# Patient Record
Sex: Male | Born: 1955 | Race: Black or African American | Hispanic: No | Marital: Married | Smoking: Never smoker
Health system: Southern US, Community
[De-identification: ages and names within clinical notes are randomized; demographics above are authoritative.]

## PROBLEM LIST (undated history)

## (undated) DIAGNOSIS — I10 Essential (primary) hypertension: Secondary | ICD-10-CM

---

## 2011-06-30 ENCOUNTER — Emergency Department (HOSPITAL_COMMUNITY)
Admission: EM | Admit: 2011-06-30 | Discharge: 2011-06-30 | Disposition: A | Discharge status: Home / Self Care | Payer: Self-pay | Attending: Emergency Medicine | Admitting: Emergency Medicine

## 2011-06-30 ENCOUNTER — Encounter: Payer: Self-pay | Admitting: *Deleted

## 2011-06-30 DIAGNOSIS — Z79899 Other long term (current) drug therapy: Secondary | ICD-10-CM | POA: Insufficient documentation

## 2011-06-30 DIAGNOSIS — M25579 Pain in unspecified ankle and joints of unspecified foot: Secondary | ICD-10-CM | POA: Insufficient documentation

## 2011-06-30 DIAGNOSIS — I10 Essential (primary) hypertension: Secondary | ICD-10-CM | POA: Insufficient documentation

## 2011-06-30 DIAGNOSIS — M109 Gout, unspecified: Secondary | ICD-10-CM | POA: Insufficient documentation

## 2011-06-30 DIAGNOSIS — M25473 Effusion, unspecified ankle: Secondary | ICD-10-CM | POA: Insufficient documentation

## 2011-06-30 DIAGNOSIS — M25476 Effusion, unspecified foot: Secondary | ICD-10-CM | POA: Insufficient documentation

## 2011-06-30 DIAGNOSIS — M79609 Pain in unspecified limb: Secondary | ICD-10-CM | POA: Insufficient documentation

## 2011-06-30 HISTORY — DX: Essential (primary) hypertension: I10

## 2011-06-30 MED ORDER — OXYCODONE-ACETAMINOPHEN 5-325 MG PO TABS
1.0000 | ORAL_TABLET | Freq: Once | ORAL | Status: AC
  Administered 2011-06-30: 1 via ORAL
  Filled 2011-06-30: qty 1

## 2011-06-30 MED ORDER — PREDNISONE 20 MG PO TABS
60.0000 mg | ORAL_TABLET | Freq: Once | ORAL | Status: AC
  Administered 2011-06-30: 60 mg via ORAL
  Filled 2011-06-30: qty 3

## 2011-06-30 MED ORDER — OXYCODONE-ACETAMINOPHEN 5-325 MG PO TABS: 1.0000 | ORAL_TABLET | Freq: Four times a day (QID) | ORAL | Status: AC | PRN

## 2011-06-30 MED ORDER — PREDNISONE 20 MG PO TABS: ORAL_TABLET | ORAL | Status: AC

## 2011-06-30 NOTE — ED Provider Notes (Signed)
History     CSN: [id number] Arrival date & time: 06/30/2011  8:32 AM   First MD Initiated Contact with Patient 06/30/11 [phone]      Chief Complaint  Patient presents with  . Foot Pain    (Consider location/radiation/quality/duration/timing/severity/associated sxs/prior treatment) HPI Comments: Patient with history of gout presents with flare in his right medial foot. The patient states that he thinks he might have eaten something he shouldn't have during Thanksgiving.  He has been taking diclofenac as prescribed during a previous flare however this is not helping. Patient denies fevers, chills, nausea or vomiting. He states this attack is similar to previous.  Patient is a 55 y.o. male presenting with lower extremity pain. The history is provided by the patient.  Foot Pain This is a recurrent problem. The current episode started in the past 7 days. The problem has been gradually worsening. Associated symptoms include arthralgias and joint swelling. Pertinent negatives include no chills, fever, numbness or weakness. The symptoms are aggravated by walking (Palpation). He has tried NSAIDs for the symptoms.    Past Medical History  Diagnosis Date  . Hypertension   . Gout     History reviewed. No pertinent past surgical history.  No family history on file.  History  Substance Use Topics  . Smoking status: Never Smoker   . Smokeless tobacco: Not on file  . Alcohol Use: No      Review of Systems  Constitutional: Negative for fever and chills.  Musculoskeletal: Positive for joint swelling and arthralgias. Negative for back pain.  Skin: Negative for wound.  Neurological: Negative for weakness and numbness.    Allergies  Review of patient's allergies indicates no known allergies.  Home Medications   Current Outpatient Rx  Name Route Sig Dispense Refill  . DICLOFENAC SODIUM 75 MG PO TBEC Oral Take 75 mg by mouth 2 (two) times daily as needed. For gout attack     .  METOPROLOL TARTRATE PO Oral Take by mouth.        BP 165/100  Pulse 96  Temp(Src) 98.1 F (36.7 C) (Oral)  Resp 20  SpO2 97%  Physical Exam  Nursing note and vitals reviewed. Constitutional: He is oriented to person, place, and time. He appears well-developed and well-nourished.  HENT:  Head: Normocephalic and atraumatic.  Eyes: Conjunctivae are normal. Right eye exhibits no discharge. Left eye exhibits no discharge.  Neck: Normal range of motion. Neck supple.  Musculoskeletal: He exhibits tenderness. He exhibits no edema.       Inflammation of R medial foot at IP, 1st MTP, and tarsal/metatarasal joint c/w gout. Mild overlying erythema, warm to touch.   Neurological: He is alert and oriented to person, place, and time.  Skin: Skin is warm and dry.  Psychiatric: He has a normal mood and affect.    ED Course  Procedures (including critical care time)  Labs Reviewed - No data to display No results found.   1. Gout     9:10 AM Patient seen and examined.   9:26 AM Patient counseled on use of narcotic pain medications. Counseled not to combine these medications with others containing tylenol. Urged not to drink alcohol, drive, or perform any other activities that requires focus while taking these medications. The patient verbalizes understanding and agrees with the plan. Counseled on avoidance and PCP f/u.    MDM  Patient with right foot pain and swelling consistent with gouty arthritis. There is no fever or concerns for septic arthritis.  Eustace Moore Ray, [place] 06/30/11 [phone]

## 2011-06-30 NOTE — ED Notes (Signed)
Patient has gout in his right foot x 3 days.  He has hx of same

## 2011-06-30 NOTE — ED Provider Notes (Signed)
Evaluation and management procedures were performed by the PA/NP under my supervision/collaboration.   Vasilis Luhman, MD 06/30/11 1551 

## 2011-07-16 ENCOUNTER — Encounter (HOSPITAL_COMMUNITY): Payer: Self-pay | Admitting: Emergency Medicine

## 2011-07-16 ENCOUNTER — Emergency Department (INDEPENDENT_AMBULATORY_CARE_PROVIDER_SITE_OTHER)
Admission: EM | Admit: 2011-07-16 | Discharge: 2011-07-16 | Disposition: A | Discharge status: Home / Self Care | Payer: Self-pay | Source: Home / Self Care | Attending: Emergency Medicine | Admitting: Emergency Medicine

## 2011-07-16 DIAGNOSIS — N390 Urinary tract infection, site not specified: Secondary | ICD-10-CM

## 2011-07-16 DIAGNOSIS — D696 Thrombocytopenia, unspecified: Secondary | ICD-10-CM

## 2011-07-16 LAB — DIFFERENTIAL
Basophils Absolute: 0 10*3/uL (ref 0.0–0.1)
Basophils Relative: 0 % (ref 0–1)
Eosinophils Relative: 2 % (ref 0–5)
Monocytes Absolute: 1.3 10*3/uL — ABNORMAL HIGH (ref 0.1–1.0)
Monocytes Relative: 16 % — ABNORMAL HIGH (ref 3–12)
Neutro Abs: 3.4 10*3/uL (ref 1.7–7.7)

## 2011-07-16 LAB — POCT URINALYSIS DIP (DEVICE)
Ketones, ur: NEGATIVE mg/dL
Leukocytes, UA: NEGATIVE
Protein, ur: 100 mg/dL — AB
Urobilinogen, UA: 2 mg/dL — ABNORMAL HIGH (ref 0.0–1.0)
pH: 5.5 (ref 5.0–8.0)

## 2011-07-16 LAB — CBC
HCT: 44.4 % (ref 39.0–52.0)
Hemoglobin: 15.7 g/dL (ref 13.0–17.0)
MCH: 34 pg (ref 26.0–34.0)
MCHC: 35.4 g/dL (ref 30.0–36.0)
MCV: 96.1 fL (ref 78.0–100.0)
RDW: 13.1 % (ref 11.5–15.5)

## 2011-07-16 MED ORDER — KETOROLAC TROMETHAMINE 60 MG/2ML IM SOLN
INTRAMUSCULAR | Status: AC
  Filled 2011-07-16: qty 2

## 2011-07-16 MED ORDER — CIPROFLOXACIN HCL 500 MG PO TABS: 500.0000 mg | ORAL_TABLET | Freq: Two times a day (BID) | ORAL | Status: AC

## 2011-07-16 NOTE — ED Provider Notes (Signed)
History     CSN: [id number] Arrival date & time: 07/16/2011 11:53 AM   First MD Initiated Contact with Patient 07/16/11 1213      Chief Complaint  Patient presents with  . Back Pain  . Abdominal Pain    (Consider location/radiation/quality/duration/timing/severity/associated sxs/prior treatment) HPI Comments: Onset of stomach bloating 2 days ago. Pain lower abdomen and low back  - bilat. Pain is a soreness, no sharp pain. And no pain currently. Tried Pepto Bismol then last night used a laxative. Had a "good BM and cleaned out" after the laxative. Abdominal and back pain has been better since used laxative. Last night felt feverish. Did not check his temperature. Urination is normal - no dysuria, frequency or hematuria.   Patient is a 55 y.o. male presenting with abdominal pain. The history is provided by the patient.  Abdominal Pain The primary symptoms of the illness include abdominal pain and fever. The primary symptoms of the illness do not include shortness of breath, nausea, vomiting, diarrhea or dysuria. The current episode started 2 days ago. The onset of the illness was sudden. The problem has not changed since onset. The abdominal pain began 2 days ago. The pain came on suddenly. The abdominal pain has been unchanged since its onset. The abdominal pain is located in the LLQ, RLQ and suprapubic region. The abdominal pain radiates to the back. The abdominal pain is exacerbated by eating.    Past Medical History  Diagnosis Date  . Hypertension   . Gout     History reviewed. No pertinent past surgical history.  History reviewed. No pertinent family history.  History  Substance Use Topics  . Smoking status: Never Smoker   . Smokeless tobacco: Not on file  . Alcohol Use: No      Review of Systems  Constitutional: Positive for fever.  Respiratory: Negative for shortness of breath.   Gastrointestinal: Positive for abdominal pain. Negative for nausea, vomiting and  diarrhea.  Genitourinary: Negative for dysuria, discharge and penile pain.    Allergies  Review of patient's allergies indicates no known allergies.  Home Medications   Current Outpatient Rx  Name Route Sig Dispense Refill  . METOPROLOL TARTRATE 50 MG PO TABS Oral Take 50 mg by mouth 2 (two) times daily.      Marland Kitchen DICLOFENAC SODIUM 75 MG PO TBEC Oral Take 75 mg by mouth 2 (two) times daily as needed. For gout attack       BP 158/93  Pulse 63  Temp(Src) 98.2 F (36.8 C) (Oral)  Resp 18  SpO2 100%  Physical Exam  Nursing note and vitals reviewed. Constitutional: He appears well-developed and well-nourished. No distress.  HENT:  Head: Normocephalic and atraumatic.  Right Ear: Tympanic membrane, external ear and ear canal normal.  Left Ear: Tympanic membrane, external ear and ear canal normal.  Nose: Nose normal.  Mouth/Throat: Uvula is midline, oropharynx is clear and moist and mucous membranes are normal. No oropharyngeal exudate, posterior oropharyngeal edema or posterior oropharyngeal erythema.  Neck: Neck supple.  Cardiovascular: Normal rate, regular rhythm and normal heart sounds.   Pulmonary/Chest: Effort normal and breath sounds normal. No respiratory distress.  Abdominal: Normal appearance and bowel sounds are normal. He exhibits no distension and no mass. There is no hepatosplenomegaly. There is tenderness (mild) in the right lower quadrant. There is no rigidity, no rebound, no guarding, no CVA tenderness and no tenderness at McBurney's point.  Lymphadenopathy:    He has no cervical adenopathy.  Neurological: He is alert.  Skin: Skin is warm and dry.  Psychiatric: He has a normal mood and affect.    ED Course  Procedures (including critical care time)  Labs Reviewed - No data to display No results found.   No diagnosis found.    MDM  Urine pos nitrite.        Melody Comas, [place] 07/16/11 1407

## 2011-07-16 NOTE — ED Notes (Signed)
Pt here with c/o lower intermitt abd pain with belching or post eating and pain radiating to lower back that started on Friday while going out of town.pt denies eating something unusual or bowel/bladder problems.pt also took laxative but the pain restarted yesterday.no vomitting or fevers

## 2011-07-17 LAB — URINE CULTURE
Colony Count: NO GROWTH
Culture: NO GROWTH

## 2011-07-17 NOTE — ED Provider Notes (Signed)
Dx uti and thrombocytopenia. Sent home with cipro for uti. Pt to f/u with [facility] for mild thrombocytopenia Medical screening examination/treatment/procedure(s) were performed by non-physician practitioner and as supervising physician I was immediately available for consultation/collaboration.  Luiz Blare MD  Luiz Blare, MD 07/17/11 Windy Fast

## 2012-01-01 ENCOUNTER — Emergency Department (INDEPENDENT_AMBULATORY_CARE_PROVIDER_SITE_OTHER)
Admission: EM | Admit: 2012-01-01 | Discharge: 2012-01-01 | Disposition: A | Discharge status: Home / Self Care | Payer: Self-pay | Source: Home / Self Care | Attending: Emergency Medicine | Admitting: Emergency Medicine

## 2012-01-01 ENCOUNTER — Encounter (HOSPITAL_COMMUNITY): Payer: Self-pay | Admitting: *Deleted

## 2012-01-01 DIAGNOSIS — I10 Essential (primary) hypertension: Secondary | ICD-10-CM

## 2012-01-01 LAB — POCT I-STAT, CHEM 8
Chloride: 105 mEq/L (ref 96–112)
HCT: 48 % (ref 39.0–52.0)
Hemoglobin: 16.3 g/dL (ref 13.0–17.0)
Potassium: 3.9 mEq/L (ref 3.5–5.1)
Sodium: 142 mEq/L (ref 135–145)

## 2012-01-01 MED ORDER — TRIAMTERENE-HCTZ 37.5-25 MG PO TABS: 1.0000 | ORAL_TABLET | Freq: Every day | ORAL | Status: DC

## 2012-01-01 NOTE — ED Notes (Signed)
Pt  Is  Starting  A  New  Job     And    And  His  Blood  Pressure  Was  Found  To  Be  High      On exam   By  [employer]   Staff  Pt    reffered  For  evaul  Of  Safety         Pt  denys  Any  Symptoms         At  This  Time     He  Takes  A  Med  For  bp   He  Has  No  Dr in  [facility]           He  Is  Awake  And  Alert      And  Oriented

## 2012-01-01 NOTE — Discharge Instructions (Signed)
We discussed high blood pressure and potential complications of uncontrolled blood pressure. He denied expressing any symptoms such as lower extremity swelling, shortness of breath or chest pains or signs of a stroke such as lower or upper extremity weakness or speech problems. I do not see any reason at this moment that we will preclude you from working. Believe you need close monitoring of your blood pressure and to establish her primary care Dr. for continuity of care and blood pressure control. Today we have modified your blood pressure medicines and have asked you to monitor your blood pressures closely to return if any new symptoms or if your blood pressure continues to be above 180 (systolic) and 110 (diastolic)     Arterial Hypertension Arterial hypertension (high blood pressure) is a condition of elevated pressure in your blood vessels. Hypertension over a long period of time is a risk factor for strokes, heart attacks, and heart failure. It is also the leading cause of kidney (renal) failure.  CAUSES   In Adults -- Over 90% of all hypertension has no known cause. This is called essential or primary hypertension. In the other 10% of people with hypertension, the increase in blood pressure is caused by another disorder. This is called secondary hypertension. Important causes of secondary hypertension are:   Heavy alcohol use.   Obstructive sleep apnea.   Hyperaldosterosim (Conn's syndrome).   Steroid use.   Chronic kidney failure.   Hyperparathyroidism.   Medications.   Renal artery stenosis.   Pheochromocytoma.   Cushing's disease.   Coarctation of the aorta.   Scleroderma renal crisis.   Licorice (in excessive amounts).   Drugs (cocaine, methamphetamine).  Your caregiver can explain any items above that apply to you.  In Children -- Secondary hypertension is more common and should always be considered.   Pregnancy -- Few women of childbearing age have high  blood pressure. However, up to 10% of them develop hypertension of pregnancy. Generally, this will not harm the woman. It may be a sign of 3 complications of pregnancy: preeclampsia, HELLP syndrome, and eclampsia. Follow up and control with medication is necessary.  SYMPTOMS   This condition normally does not produce any noticeable symptoms. It is usually found during a routine exam.   Malignant hypertension is a late problem of high blood pressure. It may have the following symptoms:   Headaches.   Blurred vision.   End-organ damage (this means your kidneys, heart, lungs, and other organs are being damaged).   Stressful situations can increase the blood pressure. If a person with normal blood pressure has their blood pressure go up while being seen by their caregiver, this is often termed "white coat hypertension." Its importance is not known. It may be related with eventually developing hypertension or complications of hypertension.   Hypertension is often confused with mental tension, stress, and anxiety.  DIAGNOSIS  The diagnosis is made by 3 separate blood pressure measurements. They are taken at least 1 week apart from each other. If there is organ damage from hypertension, the diagnosis may be made without repeat measurements. Hypertension is usually identified by having blood pressure readings:  Above 140/90 mmHg measured in both arms, at 3 separate times, over a couple weeks.   Over 130/80 mmHg should be considered a risk factor and may require treatment in patients with diabetes.  Blood pressure readings over 120/80 mmHg are called "pre-hypertension" even in non-diabetic patients. To get a true blood pressure measurement, use the following  guidelines. Be aware of the factors that can alter blood pressure readings.  Take measurements at least 1 hour after caffeine.   Take measurements 30 minutes after smoking and without any stress. This is another reason to quit smoking - it  raises your blood pressure.   Use a proper cuff size. Ask your caregiver if you are not sure about your cuff size.   Most home blood pressure cuffs are automatic. They will measure systolic and diastolic pressures. The systolic pressure is the pressure reading at the start of sounds. Diastolic pressure is the pressure at which the sounds disappear. If you are elderly, measure pressures in multiple postures. Try sitting, lying or standing.   Sit at rest for a minimum of 5 minutes before taking measurements.   You should not be on any medications like decongestants. These are found in many cold medications.   Record your blood pressure readings and review them with your caregiver.  If you have hypertension:  Your caregiver may do tests to be sure you do not have secondary hypertension (see "causes" above).   Your caregiver may also look for signs of metabolic syndrome. This is also called Syndrome X or Insulin Resistance Syndrome. You may have this syndrome if you have type 2 diabetes, abdominal obesity, and abnormal blood lipids in addition to hypertension.   Your caregiver will take your medical and family history and perform a physical exam.   Diagnostic tests may include blood tests (for glucose, cholesterol, potassium, and kidney function), a urinalysis, or an EKG. Other tests may also be necessary depending on your condition.  PREVENTION  There are important lifestyle issues that you can adopt to reduce your chance of developing hypertension:  Maintain a normal weight.   Limit the amount of salt (sodium) in your diet.   Exercise often.   Limit alcohol intake.   Get enough potassium in your diet. Discuss specific advice with your caregiver.   Follow a DASH diet (dietary approaches to stop hypertension). This diet is rich in fruits, vegetables, and low-fat dairy products, and avoids certain fats.  PROGNOSIS  Essential hypertension cannot be cured. Lifestyle changes and medical  treatment can lower blood pressure and reduce complications. The prognosis of secondary hypertension depends on the underlying cause. Many people whose hypertension is controlled with medicine or lifestyle changes can live a normal, healthy life.  RISKS AND COMPLICATIONS  While high blood pressure alone is not an illness, it often requires treatment due to its short- and long-term effects on many organs. Hypertension increases your risk for:  CVAs or strokes (cerebrovascular accident).   Heart failure due to chronically high blood pressure (hypertensive cardiomyopathy).   Heart attack (myocardial infarction).   Damage to the retina (hypertensive retinopathy).   Kidney failure (hypertensive nephropathy).  Your caregiver can explain list items above that apply to you. Treatment of hypertension can significantly reduce the risk of complications. TREATMENT   For overweight patients, weight loss and regular exercise are recommended. Physical fitness lowers blood pressure.   Mild hypertension is usually treated with diet and exercise. A diet rich in fruits and vegetables, fat-free dairy products, and foods low in fat and salt (sodium) can help lower blood pressure. Decreasing salt intake decreases blood pressure in a 1/3 of people.   Stop smoking if you are a smoker.  The steps above are highly effective in reducing blood pressure. While these actions are easy to suggest, they are difficult to achieve. Most patients with moderate or  severe hypertension end up requiring medications to bring their blood pressure down to a normal level. There are several classes of medications for treatment. Blood pressure pills (antihypertensives) will lower blood pressure by their different actions. Lowering the blood pressure by 10 mmHg may decrease the risk of complications by as much as 25%. The goal of treatment is effective blood pressure control. This will reduce your risk for complications. Your caregiver will  help you determine the best treatment for you according to your lifestyle. What is excellent treatment for one person, may not be for you. HOME CARE INSTRUCTIONS   Do not smoke.   Follow the lifestyle changes outlined in the "Prevention" section.   If you are on medications, follow the directions carefully. Blood pressure medications must be taken as prescribed. Skipping doses reduces their benefit. It also puts you at risk for problems.   Follow up with your caregiver, as directed.   If you are asked to monitor your blood pressure at home, follow the guidelines in the "Diagnosis" section above.  SEEK MEDICAL CARE IF:   You think you are having medication side effects.   You have recurrent headaches or lightheadedness.   You have swelling in your ankles.   You have trouble with your vision.  SEEK IMMEDIATE MEDICAL CARE IF:   You have sudden onset of chest pain or pressure, difficulty breathing, or other symptoms of a heart attack.   You have a severe headache.   You have symptoms of a stroke (such as sudden weakness, difficulty speaking, difficulty walking).  MAKE SURE YOU:   Understand these instructions.   Will watch your condition.   Will get help right away if you are not doing well or get worse.  Document Released: 07/23/2005 Document Revised: 07/12/2011 Document Reviewed: 02/20/2007 [facility] Patient Information 2012 [place], [place].

## 2012-01-01 NOTE — ED Provider Notes (Signed)
History     CSN: [id number]  Arrival date & time 01/01/12  1135   First MD Initiated Contact with Patient 01/01/12 1234      Chief Complaint  Patient presents with  . Hypertension    (Consider location/radiation/quality/duration/timing/severity/associated sxs/prior treatment) HPI Comments: Patient is starting a new job and during initial preemployment exam was noted to be hypertensive. He's been on metoprolol for about 5 years and he has been treated by a primary care Dr. in any city near [place]. Patient is describing that he feels fine that he never expresses any headaches or, visual changes or chest pains or shortness of breath. When asked he is unaware of his primary care Dr. at plans to increase his dose to modify his treatment space on blood pressure readings most specifically during his last visit in March of this year to see his doctor. Patient possesses a prescription that has refills until December of this year for metoprolol.  Patient is asymptomatic during interview denies any chest pains, palpitations, shortness of breath, swelling of lower extremities, and denies frequent headaches visual changes or any other neurological symptoms.  " I just need to start working and this is my opportunity but they want something that says them okay to work "  Patient is a 56 y.o. male presenting with hypertension. The history is provided by the patient.  Hypertension This is a chronic problem. The problem has not changed since onset.Pertinent negatives include no chest pain, no abdominal pain, no headaches and no shortness of breath. The treatment provided mild relief.    Past Medical History  Diagnosis Date  . Hypertension   . Gout     History reviewed. No pertinent past surgical history.  History reviewed. No pertinent family history.  History  Substance Use Topics  . Smoking status: Never Smoker   . Smokeless tobacco: Not on file  . Alcohol Use: No      Review of  Systems  Constitutional: Negative for fever, diaphoresis and activity change.  Eyes: Negative for visual disturbance.  Respiratory: Negative for shortness of breath.   Cardiovascular: Negative for chest pain, palpitations and leg swelling.  Gastrointestinal: Negative for abdominal pain.  Skin: Negative for color change.  Neurological: Negative for dizziness, syncope, facial asymmetry, weakness, numbness and headaches.    Allergies  Review of patient's allergies indicates no known allergies.  Home Medications   Current Outpatient Rx  Name Route Sig Dispense Refill  . DICLOFENAC SODIUM 75 MG PO TBEC Oral Take 75 mg by mouth 2 (two) times daily as needed. For gout attack     . METOPROLOL TARTRATE 50 MG PO TABS Oral Take 50 mg by mouth 2 (two) times daily.      . TRIAMTERENE-HCTZ 37.5-25 MG PO TABS Oral Take 1 each (1 tablet total) by mouth daily. 30 tablet 2    BP 166/92  Pulse 66  Temp(Src) 98.1 F (36.7 C) (Oral)  Resp 16  SpO2 96%  Physical Exam  Nursing note and vitals reviewed. Constitutional: He is oriented to person, place, and time. He appears well-developed and well-nourished.  Neck: No JVD present.  Cardiovascular: Normal rate, regular rhythm, normal heart sounds, intact distal pulses and normal pulses.  Exam reveals no gallop and no friction rub.   No murmur heard. Pulmonary/Chest: Effort normal and breath sounds normal. No respiratory distress. He has no decreased breath sounds. He has no wheezes. He has no rales. He exhibits no tenderness.  Lymphadenopathy:    He  has no cervical adenopathy.  Neurological: He is alert and oriented to person, place, and time. He has normal strength. No cranial nerve deficit or sensory deficit. He exhibits normal muscle tone.    ED Course  Procedures (including critical care time)  Labs Reviewed - No data to display No results found.   1. Hypertension       MDM  Patient with long history of hypertension. With a primary  care Dr. in a neighboring city. Comes in requesting a clearance note to start working. Patient was noted to be hypertensive but asymptomatic. She agreed to followup with a new primary care doctor in the area as he is living at [place] for now. And agreed to incorporate the second medicine for further blood pressure control. We divided patient to return in one or 2 weeks just for blood pressure repeat and to call primary care doctor's office to establish care within the next few weeks. Patient agree with treatment plan and followup care as necessary     Jimmie Molly, MD 01/01/12 1329

## 2013-01-07 ENCOUNTER — Emergency Department (HOSPITAL_COMMUNITY)
Admission: EM | Admit: 2013-01-07 | Discharge: 2013-01-07 | Disposition: A | Discharge status: Home / Self Care | Payer: PRIVATE HEALTH INSURANCE | Attending: Emergency Medicine | Admitting: Emergency Medicine

## 2013-01-07 ENCOUNTER — Encounter (HOSPITAL_COMMUNITY): Payer: Self-pay | Admitting: Emergency Medicine

## 2013-01-07 DIAGNOSIS — M109 Gout, unspecified: Secondary | ICD-10-CM | POA: Insufficient documentation

## 2013-01-07 DIAGNOSIS — I10 Essential (primary) hypertension: Secondary | ICD-10-CM | POA: Insufficient documentation

## 2013-01-07 DIAGNOSIS — Z79899 Other long term (current) drug therapy: Secondary | ICD-10-CM | POA: Insufficient documentation

## 2013-01-07 MED ORDER — PREDNISONE 20 MG PO TABS: ORAL_TABLET | ORAL | Status: DC

## 2013-01-07 MED ORDER — PREDNISONE 20 MG PO TABS
60.0000 mg | ORAL_TABLET | Freq: Once | ORAL | Status: AC
  Administered 2013-01-07: 60 mg via ORAL
  Filled 2013-01-07: qty 3

## 2013-01-07 MED ORDER — OXYCODONE-ACETAMINOPHEN 5-325 MG PO TABS: 1.0000 | ORAL_TABLET | Freq: Four times a day (QID) | ORAL | Status: DC | PRN

## 2013-01-07 NOTE — ED Notes (Signed)
Rt ankle swelling pain x 3 days states it is the gout

## 2013-01-07 NOTE — ED Provider Notes (Signed)
History     CSN: [id number]  Arrival date & time 01/07/13  0827   First MD Initiated Contact with Patient 01/07/13 0830      Chief Complaint  Patient presents with  . Ankle Pain    (Consider location/radiation/quality/duration/timing/severity/associated sxs/prior treatment) HPI Comments: 57 year old male past medical history of hypertension and gout presents emergency department with his wife complaining of right ankle pain and swelling x3 days. Patient states this feels similar to his prior gout flareups which are normally located in either his ankle or his first great toe. He takes indomethacin on occasional basis, however has not taken it for discomfort. He did take Aleve without any relief. Ankle pain is nonradiating, worse when anything touches it. Admits to associated swelling and warmth. States he had 2 hot dogs yesterday which may have set off the flare up. Denies fever, chills, rash.  Patient is a 57 y.o. male presenting with ankle pain. The history is provided by the patient and the spouse.  Ankle Pain Associated symptoms: no fever     Past Medical History  Diagnosis Date  . Hypertension   . Gout     No past surgical history on file.  No family history on file.  History  Substance Use Topics  . Smoking status: Never Smoker   . Smokeless tobacco: Not on file  . Alcohol Use: No      Review of Systems  Constitutional: Negative for fever and chills.  Musculoskeletal: Positive for joint swelling.       Positive for right ankle pain.  Skin: Negative for rash.  All other systems reviewed and are negative.    Allergies  Codeine  Home Medications   Current Outpatient Rx  Name  Route  Sig  Dispense  Refill  . indomethacin (INDOCIN) 25 MG capsule   Oral   Take 50 mg by mouth 2 (two) times daily with a meal.         . metoprolol (LOPRESSOR) 50 MG tablet   Oral   Take 50 mg by mouth 2 (two) times daily.           . naproxen sodium (ANAPROX) 220 MG  tablet   Oral   Take 440 mg by mouth 4 (four) times daily as needed (pain).          Marland Kitchen oxyCODONE-acetaminophen (PERCOCET) 5-325 MG per tablet   Oral   Take 1-2 tablets by mouth every 6 (six) hours as needed for pain.   10 tablet   0   . predniSONE (DELTASONE) 20 MG tablet      2 tabs po daily x 4 days   8 tablet   0     BP 162/107  Pulse 74  Temp(Src) 97.3 F (36.3 C) (Oral)  Resp 18  SpO2 98%  Physical Exam  Nursing note and vitals reviewed. Constitutional: He is oriented to person, place, and time. He appears well-developed and well-nourished. No distress.  HENT:  Head: Normocephalic and atraumatic.  Mouth/Throat: Oropharynx is clear and moist.  Eyes: Conjunctivae are normal.  Neck: Normal range of motion. Neck supple.  Cardiovascular: Normal rate, regular rhythm, normal heart sounds and intact distal pulses.   Pulmonary/Chest: Effort normal and breath sounds normal. No respiratory distress.  Musculoskeletal:  Tenderness to the medial aspect of right ankle with overlying edema and warmth. Full right ankle range of motion with pain.  Neurological: He is alert and oriented to person, place, and time.  Skin: Skin is warm  and dry. He is not diaphoretic. There is erythema (mild).  Psychiatric: He has a normal mood and affect. His behavior is normal.    ED Course  Procedures (including critical care time)  Labs Reviewed - No data to display No results found.   1. Acute gout       MDM  57 year old male with acute gout flareup. He is afebrile and in no apparent distress. Flareup more than likely from eating hot dogs 2 days prior. Physical exam unremarkable other than medial ankle tenderness, swelling and warmth. Prednisone given in the ED, along with prescription for prednisone and Percocet. He will followup with his PCP. Return precautions discussed. Patient states understanding of plan and is agreeable.       Trevor Mace, PA-C 01/07/13 [phone]

## 2013-01-08 NOTE — ED Provider Notes (Signed)
Medical screening examination/treatment/procedure(s) were performed by non-physician practitioner and as supervising physician I was immediately available for consultation/collaboration.   Suzi Roots, MD 01/08/13 0730

## 2013-02-01 ENCOUNTER — Encounter (HOSPITAL_COMMUNITY): Payer: Self-pay | Admitting: Emergency Medicine

## 2013-02-01 ENCOUNTER — Emergency Department (INDEPENDENT_AMBULATORY_CARE_PROVIDER_SITE_OTHER)
Admission: EM | Admit: 2013-02-01 | Discharge: 2013-02-01 | Disposition: A | Discharge status: Home / Self Care | Payer: PRIVATE HEALTH INSURANCE | Source: Home / Self Care | Attending: Emergency Medicine | Admitting: Emergency Medicine

## 2013-02-01 DIAGNOSIS — M109 Gout, unspecified: Secondary | ICD-10-CM

## 2013-02-01 MED ORDER — KETOROLAC TROMETHAMINE 60 MG/2ML IM SOLN: 60.0000 mg | Freq: Once | INTRAMUSCULAR | Status: DC

## 2013-02-01 MED ORDER — PREDNISONE 10 MG PO KIT: PACK | ORAL | Status: DC

## 2013-02-01 MED ORDER — KETOROLAC TROMETHAMINE 60 MG/2ML IM SOLN
INTRAMUSCULAR | Status: AC
  Filled 2013-02-01: qty 2

## 2013-02-01 MED ORDER — METHYLPREDNISOLONE ACETATE 40 MG/ML IJ SUSP: 80.0000 mg | Freq: Once | INTRAMUSCULAR | Status: DC

## 2013-02-01 MED ORDER — COLCHICINE 0.6 MG PO TABS: 0.6000 mg | ORAL_TABLET | Freq: Two times a day (BID) | ORAL | Status: DC | PRN

## 2013-02-01 MED ORDER — METHYLPREDNISOLONE ACETATE 80 MG/ML IJ SUSP
INTRAMUSCULAR | Status: AC
  Filled 2013-02-01: qty 1

## 2013-02-01 NOTE — ED Provider Notes (Signed)
History    CSN: [id number] Arrival date & time 02/01/13  1113  None    Chief Complaint  Patient presents with  . Toe Pain    right great toe pain x 2 days. hx of gout.    (Consider location/radiation/quality/duration/timing/severity/associated sxs/prior Treatment) HPI Comments: 57 year old male presents complaining of right great toe pain. He has a history of gout and says that he has had this exact presentation of gout many times. The pain is worst in the side of the toe and it hurts and joint to move the toe. He has been taking some prednisone that he had left over from a previous round of treatment as well as Indocin twice daily. He states that these have been helping a little bit but not enough. He denies any pain in any other joints or history of gout in any other joints. He denies any fever, chills or other symptoms.  Patient is a 56 y.o. male presenting with toe pain.  Toe Pain Pertinent negatives include no chest pain, no abdominal pain and no shortness of breath.   Past Medical History  Diagnosis Date  . Hypertension   . Gout    History reviewed. No pertinent past surgical history. History reviewed. No pertinent family history. History  Substance Use Topics  . Smoking status: Never Smoker   . Smokeless tobacco: Not on file  . Alcohol Use: No    Review of Systems  Constitutional: Negative for fever, chills and fatigue.  HENT: Negative for sore throat, neck pain and neck stiffness.   Eyes: Negative for visual disturbance.  Respiratory: Negative for cough and shortness of breath.   Cardiovascular: Negative for chest pain, palpitations and leg swelling.  Gastrointestinal: Negative for nausea, vomiting, abdominal pain, diarrhea and constipation.  Genitourinary: Negative for dysuria, urgency, frequency and hematuria.  Musculoskeletal: Positive for arthralgias (See history of present illness). Negative for myalgias.  Skin: Negative for rash.  Neurological: Negative for  dizziness, weakness and light-headedness.    Allergies  Codeine  Home Medications   Current Outpatient Rx  Name  Route  Sig  Dispense  Refill  . metoprolol (LOPRESSOR) 50 MG tablet   Oral   Take 50 mg by mouth 2 (two) times daily.           . colchicine 0.6 MG tablet   Oral   Take 1 tablet (0.6 mg total) by mouth 2 (two) times daily as needed.   60 tablet   0   . indomethacin (INDOCIN) 25 MG capsule   Oral   Take 50 mg by mouth 2 (two) times daily with a meal.         . naproxen sodium (ANAPROX) 220 MG tablet   Oral   Take 440 mg by mouth 4 (four) times daily as needed (pain).          Marland Kitchen oxyCODONE-acetaminophen (PERCOCET) 5-325 MG per tablet   Oral   Take 1-2 tablets by mouth every 6 (six) hours as needed for pain.   10 tablet   0   . predniSONE (DELTASONE) 20 MG tablet      2 tabs po daily x 4 days   8 tablet   0   . PredniSONE 10 MG KIT      12 day taper dose pack, use as directed   1 kit   0    BP 153/104  Pulse 68  Temp(Src) 97.9 F (36.6 C) (Oral)  Resp 12  SpO2 100% Physical  Exam  Nursing note and vitals reviewed. Constitutional: He is oriented to person, place, and time. He appears well-developed and well-nourished. No distress.  HENT:  Head: Normocephalic and atraumatic.  Eyes: EOM are normal. Pupils are equal, round, and reactive to light.  Cardiovascular: Normal rate and regular rhythm.  Exam reveals no gallop and no friction rub.   No murmur heard. Pulmonary/Chest: Effort normal and breath sounds normal. No respiratory distress. He has no wheezes. He has no rales.  Abdominal: Soft. There is no tenderness.  Musculoskeletal:       Right foot: He exhibits tenderness. He exhibits no bony tenderness and no swelling.  Right first MTP is erythematous, swollen, tender.  Neurological: He is oriented to person, place, and time.  Skin: Skin is warm and dry. No rash noted.  Psychiatric: He has a normal mood and affect. Judgment normal.    ED  Course  Procedures (including critical care time) Labs Reviewed - No data to display No results found. 1. Podagra     MDM  We'll give IM of Depo-Medrol and Toradol here today. We'll treat with a tapered dose pack prednisone and colchicine. He will followup in a week here or with his primary care physician.   Meds ordered this encounter  Medications  . methylPREDNISolone acetate (DEPO-MEDROL) injection 80 mg    Sig:   . ketorolac (TORADOL) injection 60 mg    Sig:   . colchicine 0.6 MG tablet    Sig: Take 1 tablet (0.6 mg total) by mouth 2 (two) times daily as needed.    Dispense:  60 tablet    Refill:  0  . PredniSONE 10 MG KIT    Sig: 12 day taper dose pack, use as directed    Dispense:  1 kit    Refill:  0     Graylon Good, PA-C 02/01/13 1226

## 2013-02-01 NOTE — ED Notes (Signed)
Report right great toe pain at the joint. Pt has a hx of gout. Denies any other symptoms. Pt has taken ibuprofen and aleve with no relief.

## 2013-02-02 NOTE — ED Provider Notes (Signed)
Medical screening examination/treatment/procedure(s) were performed by non-physician practitioner and as supervising physician I was immediately available for consultation/collaboration.   MORENO-COLL,Jacinto Keil; MD  Tamyra Fojtik Moreno-Coll, MD 02/02/13 0813 

## 2013-02-02 NOTE — ED Notes (Signed)
Spoke w Z Baker, prescribe of colchicine , and per his instructions, patient is to be advised to hold the colchicine and continue w the rx indocin, prednisone. Attempted to call patient , but unable to reach; left message on phone

## 2013-02-02 NOTE — ED Notes (Signed)
Chart review for phone call on answering machine

## 2014-06-06 ENCOUNTER — Emergency Department (INDEPENDENT_AMBULATORY_CARE_PROVIDER_SITE_OTHER)
Admission: EM | Admit: 2014-06-06 | Discharge: 2014-06-06 | Disposition: A | Discharge status: Home / Self Care | Payer: Self-pay | Source: Home / Self Care | Attending: Emergency Medicine | Admitting: Emergency Medicine

## 2014-06-06 ENCOUNTER — Encounter (HOSPITAL_COMMUNITY): Payer: Self-pay

## 2014-06-06 DIAGNOSIS — R030 Elevated blood-pressure reading, without diagnosis of hypertension: Secondary | ICD-10-CM

## 2014-06-06 DIAGNOSIS — M109 Gout, unspecified: Secondary | ICD-10-CM

## 2014-06-06 DIAGNOSIS — IMO0001 Reserved for inherently not codable concepts without codable children: Secondary | ICD-10-CM

## 2014-06-06 DIAGNOSIS — M10071 Idiopathic gout, right ankle and foot: Secondary | ICD-10-CM

## 2014-06-06 MED ORDER — COLCHICINE 0.6 MG PO TABS: ORAL_TABLET | ORAL | Status: DC

## 2014-06-06 NOTE — ED Provider Notes (Signed)
CSN: [id number]     Arrival date & time 06/06/14  1030 History   First MD Initiated Contact with Patient 06/06/14 1037     Chief Complaint  Patient presents with  . Foot Pain   (Consider location/radiation/quality/duration/timing/severity/associated sxs/prior Treatment) HPI Comments: No injury Works as Geophysicist/field seismologisttemporary laborer PCP: none  Patient is a 58 y.o. male presenting with lower extremity pain. The history is provided by the patient.  Foot Pain This is a recurrent (right ankle) problem. Episode onset: sx began 3 days ago and are reported to be consistent with previous gout flares. The problem occurs constantly. The problem has not changed since onset.   Past Medical History  Diagnosis Date  . Hypertension   . Gout    History reviewed. No pertinent past surgical history. History reviewed. No pertinent family history. History  Substance Use Topics  . Smoking status: Never Smoker   . Smokeless tobacco: Not on file  . Alcohol Use: No    Review of Systems  Allergies  Codeine  Home Medications   Prior to Admission medications   Medication Sig Start Date End Date Taking? Authorizing Provider  indomethacin (INDOCIN) 25 MG capsule Take 50 mg by mouth 2 (two) times daily with a meal.   Yes Historical Provider, MD  metoprolol (LOPRESSOR) 50 MG tablet Take 50 mg by mouth 2 (two) times daily.     Yes Historical Provider, MD  colchicine 0.6 MG tablet Take two tabs by mouth now and then a third tablet in one hour 06/06/14   Ria ClockJennifer Lee H Joseguadalupe Stan, PA  oxyCODONE-acetaminophen (PERCOCET) 5-325 MG per tablet Take 1-2 tablets by mouth every 6 (six) hours as needed for pain. 01/07/13   Robyn M Hess, PA-C   BP 156/104 mmHg  Pulse 80  Temp(Src) 98.7 F (37.1 C) (Oral)  Resp 14  SpO2 95% Physical Exam  Constitutional: He is oriented to person, place, and time. He appears well-developed and well-nourished. No distress.  HENT:  Head: Normocephalic and atraumatic.  Eyes: Conjunctivae are  normal.  Cardiovascular: Normal rate, regular rhythm and normal heart sounds.   Pulmonary/Chest: Effort normal and breath sounds normal.  Musculoskeletal: He exhibits edema and tenderness.  @ right medial ankle ROM only limited by discomfort  Neurological: He is alert and oriented to person, place, and time.  Skin: Skin is warm and dry. No rash noted. There is erythema.  @right  medial ankle with mild STS  Psychiatric: He has a normal mood and affect. His behavior is normal.  Nursing note and vitals reviewed.   ED Course  Procedures (including critical care time) Labs Review Labs Reviewed - No data to display  Imaging Review No results found.   MDM   1. Acute gout of right ankle, unspecified cause   2. Elevated blood pressure    Colchicine as prescribed Needs to find PCP as his BP is not well controlled on current therapy. Follow up prn.    Ria ClockJennifer Lee H Brette Cast, PA 06/06/14 [phone]

## 2014-06-06 NOTE — Discharge Instructions (Signed)
Please use medication as prescribed. Plenty of fluids. Your blood pressure is not well controlled on your current medication. Please locate a primary care doctor of your choice as soon as possible for follow up.  Gout Gout is an inflammatory arthritis caused by a buildup of uric acid crystals in the joints. Uric acid is a chemical that is normally present in the blood. When the level of uric acid in the blood is too high it can form crystals that deposit in your joints and tissues. This causes joint redness, soreness, and swelling (inflammation). Repeat attacks are common. Over time, uric acid crystals can form into masses (tophi) near a joint, destroying bone and causing disfigurement. Gout is treatable and often preventable. CAUSES  The disease begins with elevated levels of uric acid in the blood. Uric acid is produced by your body when it breaks down a naturally found substance called purines. Certain foods you eat, such as meats and fish, contain high amounts of purines. Causes of an elevated uric acid level include:  Being passed down from parent to child (heredity).  Diseases that cause increased uric acid production (such as obesity, psoriasis, and certain cancers).  Excessive alcohol use.  Diet, especially diets rich in meat and seafood.  Medicines, including certain cancer-fighting medicines (chemotherapy), water pills (diuretics), and aspirin.  Chronic kidney disease. The kidneys are no longer able to remove uric acid well.  Problems with metabolism. Conditions strongly associated with gout include:  Obesity.  High blood pressure.  High cholesterol.  Diabetes. Not everyone with elevated uric acid levels gets gout. It is not understood why some people get gout and others do not. Surgery, joint injury, and eating too much of certain foods are some of the factors that can lead to gout attacks. SYMPTOMS   An attack of gout comes on quickly. It causes intense pain with redness,  swelling, and warmth in a joint.  Fever can occur.  Often, only one joint is involved. Certain joints are more commonly involved:  Base of the big toe.  Knee.  Ankle.  Wrist.  Finger. Without treatment, an attack usually goes away in a few days to weeks. Between attacks, you usually will not have symptoms, which is different from many other forms of arthritis. DIAGNOSIS  Your caregiver will suspect gout based on your symptoms and exam. In some cases, tests may be recommended. The tests may include:  Blood tests.  Urine tests.  X-rays.  Joint fluid exam. This exam requires a needle to remove fluid from the joint (arthrocentesis). Using a microscope, gout is confirmed when uric acid crystals are seen in the joint fluid. TREATMENT  There are two phases to gout treatment: treating the sudden onset (acute) attack and preventing attacks (prophylaxis).  Treatment of an Acute Attack.  Medicines are used. These include anti-inflammatory medicines or steroid medicines.  An injection of steroid medicine into the affected joint is sometimes necessary.  The painful joint is rested. Movement can worsen the arthritis.  You may use warm or cold treatments on painful joints, depending which works best for you.  Treatment to Prevent Attacks.  If you suffer from frequent gout attacks, your caregiver may advise preventive medicine. These medicines are started after the acute attack subsides. These medicines either help your kidneys eliminate uric acid from your body or decrease your uric acid production. You may need to stay on these medicines for a very long time.  The early phase of treatment with preventive medicine can be  associated with an increase in acute gout attacks. For this reason, during the first few months of treatment, your caregiver may also advise you to take medicines usually used for acute gout treatment. Be sure you understand your caregiver's directions. Your caregiver may  make several adjustments to your medicine dose before these medicines are effective.  Discuss dietary treatment with your caregiver or dietitian. Alcohol and drinks high in sugar and fructose and foods such as meat, poultry, and seafood can increase uric acid levels. Your caregiver or dietitian can advise you on drinks and foods that should be limited. HOME CARE INSTRUCTIONS   Do not take aspirin to relieve pain. This raises uric acid levels.  Only take over-the-counter or prescription medicines for pain, discomfort, or fever as directed by your caregiver.  Rest the joint as much as possible. When in bed, keep sheets and blankets off painful areas.  Keep the affected joint raised (elevated).  Apply warm or cold treatments to painful joints. Use of warm or cold treatments depends on which works best for you.  Use crutches if the painful joint is in your leg.  Drink enough fluids to keep your urine clear or pale yellow. This helps your body get rid of uric acid. Limit alcohol, sugary drinks, and fructose drinks.  Follow your dietary instructions. Pay careful attention to the amount of protein you eat. Your daily diet should emphasize fruits, vegetables, whole grains, and fat-free or low-fat milk products. Discuss the use of coffee, vitamin C, and cherries with your caregiver or dietitian. These may be helpful in lowering uric acid levels.  Maintain a healthy body weight. SEEK MEDICAL CARE IF:   You develop diarrhea, vomiting, or any side effects from medicines.  You do not feel better in 24 hours, or you are getting worse. SEEK IMMEDIATE MEDICAL CARE IF:   Your joint becomes suddenly more tender, and you have chills or a fever. MAKE SURE YOU:   Understand these instructions.  Will watch your condition.  Will get help right away if you are not doing well or get worse. Document Released: 07/20/2000 Document Revised: 12/07/2013 Document Reviewed: 03/05/2012 [facility] Patient  Information 2015 [place], [place]. This information is not intended to replace advice given to you by your health care provider. Make sure you discuss any questions you have with your health care provider.  Hypertension Hypertension, commonly called high blood pressure, is when the force of blood pumping through your arteries is too strong. Your arteries are the blood vessels that carry blood from your heart throughout your body. A blood pressure reading consists of a higher number over a lower number, such as 110/72. The higher number (systolic) is the pressure inside your arteries when your heart pumps. The lower number (diastolic) is the pressure inside your arteries when your heart relaxes. Ideally you want your blood pressure below 120/80. Hypertension forces your heart to work harder to pump blood. Your arteries may become narrow or stiff. Having hypertension puts you at risk for heart disease, stroke, and other problems.  RISK FACTORS Some risk factors for high blood pressure are controllable. Others are not.  Risk factors you cannot control include:   Race. You may be at higher risk if you are African American.  Age. Risk increases with age.  Gender. Men are at higher risk than women before age 59 years. After age 59, women are at higher risk than men. Risk factors you can control include:  Not getting enough exercise or physical activity.  Being overweight.  Getting too much fat, sugar, calories, or salt in your diet.  Drinking too much alcohol. SIGNS AND SYMPTOMS Hypertension does not usually cause signs or symptoms. Extremely high blood pressure (hypertensive crisis) may cause headache, anxiety, shortness of breath, and nosebleed. DIAGNOSIS  To check if you have hypertension, your health care provider will measure your blood pressure while you are seated, with your arm held at the level of your heart. It should be measured at least twice using the same arm. Certain conditions can  cause a difference in blood pressure between your right and left arms. A blood pressure reading that is higher than normal on one occasion does not mean that you need treatment. If one blood pressure reading is high, ask your health care provider about having it checked again. TREATMENT  Treating high blood pressure includes making lifestyle changes and possibly taking medicine. Living a healthy lifestyle can help lower high blood pressure. You may need to change some of your habits. Lifestyle changes may include:  Following the DASH diet. This diet is high in fruits, vegetables, and whole grains. It is low in salt, red meat, and added sugars.  Getting at least 2 hours of brisk physical activity every week.  Losing weight if necessary.  Not smoking.  Limiting alcoholic beverages.  Learning ways to reduce stress. If lifestyle changes are not enough to get your blood pressure under control, your health care provider may prescribe medicine. You may need to take more than one. Work closely with your health care provider to understand the risks and benefits. HOME CARE INSTRUCTIONS  Have your blood pressure rechecked as directed by your health care provider.   Take medicines only as directed by your health care provider. Follow the directions carefully. Blood pressure medicines must be taken as prescribed. The medicine does not work as well when you skip doses. Skipping doses also puts you at risk for problems.   Do not smoke.   Monitor your blood pressure at home as directed by your health care provider. SEEK MEDICAL CARE IF:   You think you are having a reaction to medicines taken.  You have recurrent headaches or feel dizzy.  You have swelling in your ankles.  You have trouble with your vision. SEEK IMMEDIATE MEDICAL CARE IF:  You develop a severe headache or confusion.  You have unusual weakness, numbness, or feel faint.  You have severe chest or abdominal pain.  You  vomit repeatedly.  You have trouble breathing. MAKE SURE YOU:   Understand these instructions.  Will watch your condition.  Will get help right away if you are not doing well or get worse. Document Released: 07/23/2005 Document Revised: 12/07/2013 Document Reviewed: 05/15/2013 [facility]Exit[facility] Patient Information 2015 [place], [place]. This information is not intended to replace advice given to you by your health care provider. Make sure you discuss any questions you have with your health care provider.

## 2014-06-06 NOTE — ED Notes (Signed)
C/o gout flare up right foot x 2 days. No better w indocin, out of prednisone. Attributes flare up to " red meat "

## 2014-06-07 MED ORDER — PREDNISONE 10 MG PO TABS: ORAL_TABLET | ORAL | Status: DC

## 2014-06-07 NOTE — Progress Notes (Signed)
06/07/2014: patient's wife called to say that patient had taken colchicine as prescribed and that this medication had not helped with symptoms. Patient and wife requesting prescription for prednisone as he states this medication has helped in the past. Prescription for  5 day tapering course of prednisone sent to patient's pharmacy electronically. Wife states she will pick up prescription today.

## 2014-08-03 ENCOUNTER — Encounter (HOSPITAL_COMMUNITY): Payer: Self-pay | Admitting: Emergency Medicine

## 2014-08-03 ENCOUNTER — Emergency Department (HOSPITAL_COMMUNITY)
Admission: EM | Admit: 2014-08-03 | Discharge: 2014-08-03 | Disposition: A | Discharge status: Home / Self Care | Payer: Self-pay | Attending: Emergency Medicine | Admitting: Emergency Medicine

## 2014-08-03 ENCOUNTER — Emergency Department (HOSPITAL_COMMUNITY): Payer: Self-pay

## 2014-08-03 DIAGNOSIS — M109 Gout, unspecified: Secondary | ICD-10-CM

## 2014-08-03 DIAGNOSIS — R52 Pain, unspecified: Secondary | ICD-10-CM

## 2014-08-03 DIAGNOSIS — Z79899 Other long term (current) drug therapy: Secondary | ICD-10-CM | POA: Insufficient documentation

## 2014-08-03 DIAGNOSIS — M25461 Effusion, right knee: Secondary | ICD-10-CM | POA: Insufficient documentation

## 2014-08-03 DIAGNOSIS — M10061 Idiopathic gout, right knee: Secondary | ICD-10-CM | POA: Insufficient documentation

## 2014-08-03 DIAGNOSIS — Z791 Long term (current) use of non-steroidal anti-inflammatories (NSAID): Secondary | ICD-10-CM | POA: Insufficient documentation

## 2014-08-03 DIAGNOSIS — M25561 Pain in right knee: Secondary | ICD-10-CM

## 2014-08-03 DIAGNOSIS — I10 Essential (primary) hypertension: Secondary | ICD-10-CM | POA: Insufficient documentation

## 2014-08-03 MED ORDER — NAPROXEN 500 MG PO TABS: 500.0000 mg | ORAL_TABLET | Freq: Two times a day (BID) | ORAL | Status: AC | PRN

## 2014-08-03 MED ORDER — OXYCODONE-ACETAMINOPHEN 5-325 MG PO TABS: 1.0000 | ORAL_TABLET | Freq: Four times a day (QID) | ORAL | Status: DC | PRN

## 2014-08-03 MED ORDER — PREDNISONE 20 MG PO TABS: ORAL_TABLET | ORAL | Status: DC

## 2014-08-03 MED ORDER — METOPROLOL TARTRATE 50 MG PO TABS: 50.0000 mg | ORAL_TABLET | Freq: Two times a day (BID) | ORAL | Status: DC

## 2014-08-03 MED ORDER — DOXYCYCLINE HYCLATE 100 MG PO CAPS: 100.0000 mg | ORAL_CAPSULE | Freq: Two times a day (BID) | ORAL | Status: DC

## 2014-08-03 NOTE — ED Notes (Signed)
Pt c/o right knee pain and swelling x 2 days; pt sts hx of gout and unsure if same

## 2014-08-03 NOTE — ED Notes (Signed)
PA at bedside for evaluation

## 2014-08-03 NOTE — Discharge Instructions (Signed)
Use knee compression sleeve as needed for comfort. Use crutches as needed for comfort. Ice and elevate knee throughout the day. Use prednisone as directed for your gout flare. Use doxycycline as directed to help any possible infection. Alternate between naprosyn and percocet as needed for pain relief. Do not drive or operate machinery with pain medication use. Call orthopedic follow up today or tomorrow to schedule followup appointment for recheck in 1 week. Take metoprolol as directed for your blood pressure, make an appointment with [facility] for ongoing medical care and refill needs. Return to the ER for changes or worsening symptoms.   Knee Effusion  Knee effusion means you have fluid in your knee. The knee may be more difficult to bend and move. HOME CARE  Use crutches or a brace as told by your doctor.  Put ice on the injured area.  Put ice in a plastic bag.  Place a towel between your skin and the bag.  Leave the ice on for 15-20 minutes, 03-04 times a day.  Raise (elevate) your knee as much as possible.  Only take medicine as told by your doctor.  You may need to do strengthening exercises. Ask your doctor.  Continue with your normal diet and activities as told by your doctor. GET HELP RIGHT AWAY IF:  You have more puffiness (swelling) in your knee.  You see redness, puffiness, or have more pain in your knee.  You have a temperature by mouth above 102 F (38.9 C).  You get a rash.  You have trouble breathing.  You have a reaction to any medicine you are taking.  You have a lot of pain when you move your knee. MAKE SURE YOU:  Understand these instructions.  Will watch your condition.  Will get help right away if you are not doing well or get worse. Document Released: 08/25/2010 Document Revised: 10/15/2011 Document Reviewed: 08/25/2010 [facility] Patient Information 2015 [place], [place]. This information is not intended to replace advice given to  you by your health care provider. Make sure you discuss any questions you have with your health care provider.  Low-Purine Diet Purines are compounds that affect the level of uric acid in your body. A low-purine diet is a diet that is low in purines. Eating a low-purine diet can prevent the level of uric acid in your body from getting too high and causing gout or kidney stones or both. WHAT DO I NEED TO KNOW ABOUT THIS DIET?  Choose low-purine foods. Examples of low-purine foods are listed in the next section.  Drink plenty of fluids, especially water. Fluids can help remove uric acid from your body. Try to drink 8-16 cups (1.9-3.8 L) a day.  Limit foods high in fat, especially saturated fat, as fat makes it harder for the body to get rid of uric acid. Foods high in saturated fat include pizza, cheese, ice cream, whole milk, fried foods, and gravies. Choose foods that are lower in fat and lean sources of protein. Use olive oil when cooking as it contains healthy fats that are not high in saturated fat.  Limit alcohol. Alcohol interferes with the elimination of uric acid from your body. If you are having a gout attack, avoid all alcohol.  Keep in mind that different people's bodies react differently to different foods. You will probably learn over time which foods do or do not affect you. If you discover that a food tends to cause your gout to flare up, avoid eating  that food. You can more freely enjoy foods that do not cause problems. If you have any questions about a food item, talk to your dietitian or health care provider. WHICH FOODS ARE LOW, MODERATE, AND HIGH IN PURINES? The following is a list of foods that are low, moderate, and high in purines. You can eat any amount of the foods that are low in purines. You may be able to have small amounts of foods that are moderate in purines. Ask your health care provider how much of a food moderate in purines you can have. Avoid foods high in  purines. Grains  Foods low in purines: Enriched white bread, pasta, rice, cake, cornbread, popcorn.  Foods moderate in purines: Whole-grain breads and cereals, wheat germ, bran, oatmeal. Uncooked oatmeal. Dry wheat bran or wheat germ.  Foods high in purines: Pancakes, [place] toast, biscuits, muffins. Vegetables  Foods low in purines: All vegetables, except those that are moderate in purines.  Foods moderate in purines: Asparagus, cauliflower, spinach, mushrooms, green peas. Fruits  All fruits are low in purines. Meats and other Protein Foods  Foods low in purines: Eggs, nuts, peanut butter.  Foods moderate in purines: 80-90% lean beef, lamb, veal, pork, poultry, fish, eggs, peanut butter, nuts. Crab, lobster, oysters, and shrimp. Cooked dried beans, peas, and lentils.  Foods high in purines: Anchovies, sardines, herring, mussels, tuna, codfish, scallops, trout, and haddock. Tomasa BlaseBacon. Organ meats (such as liver or kidney). Tripe. Game meat. Goose. Sweetbreads. Dairy  All dairy foods are low in purines. Low-fat and fat-free dairy products are best because they are low in saturated fat. Beverages  Drinks low in purines: Water, carbonated beverages, tea, coffee, cocoa.  Drinks moderate in purines: Soft drinks and other drinks sweetened with high-fructose corn syrup. Juices. To find whether a food or drink is sweetened with high-fructose corn syrup, look at the ingredients list.  Drinks high in purines: Alcoholic beverages (such as beer). Condiments  Foods low in purines: Salt, herbs, olives, pickles, relishes, vinegar.  Foods moderate in purines: Butter, margarine, oils, mayonnaise. Fats and Oils  Foods low in purines: All types, except gravies and sauces made with meat.  Foods high in purines: Gravies and sauces made with meat. Other Foods  Foods low in purines: Sugars, sweets, gelatin. Cake. Soups made without meat.  Foods moderate in purines: Meat-based or fish-based soups,  broths, or bouillons. Foods and drinks sweetened with high-fructose corn syrup.  Foods high in purines: High-fat desserts (such as ice cream, cookies, cakes, pies, doughnuts, and chocolate). Contact your dietitian for more information on foods that are not listed here. Document Released: 11/17/2010 Document Revised: 07/28/2013 Document Reviewed: 06/29/2013 [facility] Patient Information 2015 [place], [place]. This information is not intended to replace advice given to you by your health care provider. Make sure you discuss any questions you have with your health care provider.  Gout Gout is when your joints become red, sore, and swell (inflamed). This is caused by the buildup of uric acid crystals in the joints. Uric acid is a chemical that is normally in the blood. If the level of uric acid gets too high in the blood, these crystals form in your joints and tissues. Over time, these crystals can form into masses near the joints and tissues. These masses can destroy bone and cause the bone to look misshapen (deformed). HOME CARE   Do not take aspirin for pain.  Only take medicine as told by your doctor.  Rest the joint as much as  you can. When in bed, keep sheets and blankets off painful areas.  Keep the sore joints raised (elevated).  Put warm or cold packs on painful joints. Use of warm or cold packs depends on which works best for you.  Use crutches if the painful joint is in your leg.  Drink enough fluids to keep your pee (urine) clear or pale yellow. Limit alcohol, sugary drinks, and drinks with fructose in them.  Follow your diet instructions. Pay careful attention to how much protein you eat. Include fruits, vegetables, whole grains, and fat-free or low-fat milk products in your daily diet. Talk to your doctor or dietitian about the use of coffee, vitamin C, and cherries. These may help lower uric acid levels.  Keep a healthy body weight. GET HELP RIGHT AWAY IF:   You have watery  poop (diarrhea), throw up (vomit), or have any side effects from medicines.  You do not feel better in 24 hours, or you are getting worse.  Your joint becomes suddenly more tender, and you have chills or a fever. MAKE SURE YOU:   Understand these instructions.  Will watch your condition.  Will get help right away if you are not doing well or get worse. Document Released: 05/01/2008 Document Revised: 12/07/2013 Document Reviewed: 03/05/2012 [facility] Patient Information 2015 [place], [place]. This information is not intended to replace advice given to you by your health care provider. Make sure you discuss any questions you have with your health care provider.  Cryotherapy Cryotherapy is when you put ice on your injury. Ice helps lessen pain and puffiness (swelling) after an injury. Ice works the best when you start using it in the first 24 to 48 hours after an injury. HOME CARE  Put a dry or damp towel between the ice pack and your skin.  You may press gently on the ice pack.  Leave the ice on for no more than 10 to 20 minutes at a time.  Check your skin after 5 minutes to make sure your skin is okay.  Rest at least 20 minutes between ice pack uses.  Stop using ice when your skin loses feeling (numbness).  Do not use ice on someone who cannot tell you when it hurts. This includes small children and people with memory problems (dementia). GET HELP RIGHT AWAY IF:  You have white spots on your skin.  Your skin turns blue or pale.  Your skin feels waxy or hard.  Your puffiness gets worse. MAKE SURE YOU:   Understand these instructions.  Will watch your condition.  Will get help right away if you are not doing well or get worse. Document Released: 01/09/2008 Document Revised: 10/15/2011 Document Reviewed: 03/15/2011 [facility] Patient Information 2015 [place], [place]. This information is not intended to replace advice given to you by your health care provider. Make sure you  discuss any questions you have with your health care provider.  Hypertension Hypertension is another name for high blood pressure. High blood pressure forces your heart to work harder to pump blood. A blood pressure reading has two numbers, which includes a higher number over a lower number (example: 110/72). HOME CARE   Have your blood pressure rechecked by your doctor.  Only take medicine as told by your doctor. Follow the directions carefully. The medicine does not work as well if you skip doses. Skipping doses also puts you at risk for problems.  Do not smoke.  Monitor your blood pressure at home as told by your doctor.  GET HELP IF:  You think you are having a reaction to the medicine you are taking.  You have repeat headaches or feel dizzy.  You have puffiness (swelling) in your ankles.  You have trouble with your vision. GET HELP RIGHT AWAY IF:   You get a very bad headache and are confused.  You feel weak, numb, or faint.  You get chest or belly (abdominal) pain.  You throw up (vomit).  You cannot breathe very well. MAKE SURE YOU:   Understand these instructions.  Will watch your condition.  Will get help right away if you are not doing well or get worse. Document Released: 01/09/2008 Document Revised: 07/28/2013 Document Reviewed: 05/15/2013 [facility] Patient Information 2015 [place], [place]. This information is not intended to replace advice given to you by your health care provider. Make sure you discuss any questions you have with your health care provider.  How to Take Your Blood Pressure HOW DO I GET A BLOOD PRESSURE MACHINE?  You can buy an electronic home blood pressure machine at your local pharmacy. Insurance will sometimes cover the cost if you have a prescription.  Ask your doctor what type of machine is best for you. There are different machines for your arm and your wrist.  If you decide to buy a machine to check your blood pressure on your arm,  first check the size of your arm so you can buy the right size cuff. To check the size of your arm:   Use a measuring tape that shows both inches and centimeters.   Wrap the measuring tape around the upper-middle part of your arm. You may need someone to help you measure.   Write down your arm measurement in both inches and centimeters.   To measure your blood pressure correctly, it is important to have the right size cuff.   If your arm is up to 13 inches (up to 34 centimeters), get an adult cuff size.  If your arm is 13 to 17 inches (35 to 44 centimeters), get a large adult cuff size.    If your arm is 17 to 20 inches (45 to 52 centimeters), get an adult thigh cuff.  WHAT DO THE NUMBERS MEAN?   There are two numbers that make up your blood pressure. For example: 120/80.  The first number (120 in our example) is called the "systolic pressure." It is a measure of the pressure in your blood vessels when your heart is pumping blood.  The second number (80 in our example) is called the "diastolic pressure." It is a measure of the pressure in your blood vessels when your heart is resting between beats.  Your doctor will tell you what your blood pressure should be. WHAT SHOULD I DO BEFORE I CHECK MY BLOOD PRESSURE?   Try to rest or relax for at least 30 minutes before you check your blood pressure.  Do not smoke.  Do not have any drinks with caffeine, such as:  Soda.  Coffee.  Tea.  Check your blood pressure in a quiet room.  Sit down and stretch out your arm on a table. Keep your arm at about the level of your heart. Let your arm relax.  Make sure that your legs are not crossed. HOW DO I CHECK MY BLOOD PRESSURE?  Follow the directions that came with your machine.  Make sure you remove any tight-fighting clothing from your arm or wrist. Wrap the cuff around your upper arm or wrist. You should  be able to fit a finger between the cuff and your arm. If you cannot fit a  finger between the cuff and your arm, it is too tight and should be removed and rewrapped.  Some units require you to manually pump up the arm cuff.  Automatic units inflate the cuff when you press a button.  Cuff deflation is automatic in both models.  After the cuff is inflated, the unit measures your blood pressure and pulse. The readings are shown on a monitor. Hold still and breathe normally while the cuff is inflated.  Getting a reading takes less than a minute.  Some models store readings in a memory. Some provide a printout of readings. If your machine does not store your readings, keep a written record.  Take readings with you to your next visit with your doctor. Document Released: 07/05/2008 Document Revised: 12/07/2013 Document Reviewed: 09/17/2013 [facility] Patient Information 2015 [place], [place]. This information is not intended to replace advice given to you by your health care provider. Make sure you discuss any questions you have with your health care provider.

## 2014-08-03 NOTE — ED Provider Notes (Signed)
CSN: [id number]     Arrival date & time 08/03/14  1329 History  This chart was scribed for non-physician practitioner, Allen DerryMercedes Camprubi-Soms, PA-C working with Richardean Canalavid H Yao, MD by Greggory StallionKayla Andersen, ED scribe. This patient was seen in room TR04C/TR04C and the patient's care was started at 2:57 PM.    Chief Complaint  Patient presents with  . Knee Pain   Patient is a 58 y.o. male presenting with knee pain. The history is provided by the patient. No language interpreter was used.  Knee Pain Location:  Knee Time since incident:  2 days Injury: no   Knee location:  R knee Pain details:    Quality:  Aching and throbbing   Radiates to:  Does not radiate   Severity:  Severe   Onset quality:  Gradual   Duration:  2 days   Timing:  Constant   Progression:  Unchanged Chronicity:  New (but similar to prior gout flares, just in a different location) Dislocation: no   Foreign body present:  No foreign bodies Prior injury to area:  No Relieved by:  Nothing Worsened by:  Flexion and extension (knee movements) Ineffective treatments:  NSAIDs (indocin) Associated symptoms: decreased ROM (slightly, due to pain), stiffness and swelling   Associated symptoms: no back pain, no fever, no muscle weakness, no neck pain and no tingling     HPI Comments: Erik York is a 58 y.o. male with history of gout and HTN, who presents to the Emergency Department complaining of right knee pain with associated swelling, redness, and warmth that started 2 days ago. He is unsure if this is a gout flare up, states he's never had gout in his knees but has had it in his foot and ankle, and this pain feels very similar. Denies injury to knee or skin, no wounds. Rates pain 8/10 and describes it as aching and throbbing. Located in lateral aspect and it does not radiate. Knee movements worsen pain, especially flexion after prolonged immobilization. Pt has taken ibuprofen and indocin with no relief. He states the indocin and  colchicine doesn't normally help with his gout flare ups and that prednisone is the only treatment that works. He cannot recall any inciting reason for his flare, no red meats recently. Denies fever, chills, chest pain, SOB, abdominal pain, nausea, emesis, diarrhea, difficulty urinating, dysuria, hematuria, eye pain/redness, numbness, weakness, tingling, or red streaking. Pt did not take his hypertension medication today because he "ran out".   Past Medical History  Diagnosis Date  . Hypertension   . Gout    History reviewed. No pertinent past surgical history. History reviewed. No pertinent family history. History  Substance Use Topics  . Smoking status: Never Smoker   . Smokeless tobacco: Not on file  . Alcohol Use: No    Review of Systems  Constitutional: Negative for fever and chills.  Eyes: Negative for pain and redness.  Respiratory: Negative for shortness of breath.   Cardiovascular: Negative for chest pain.  Gastrointestinal: Negative for nausea, vomiting and abdominal pain.  Genitourinary: Negative for dysuria, hematuria and difficulty urinating.  Musculoskeletal: Positive for joint swelling, arthralgias, gait problem (painful but able to bear weight) and stiffness. Negative for myalgias, back pain and neck pain.  Skin: Positive for color change (R knee). Negative for wound.  Allergic/Immunologic: Negative for immunocompromised state.  Neurological: Negative for weakness and numbness.  Hematological: Does not bruise/bleed easily.  10 systems reviewed and are negative for acute changes except as noted in the  HPI.  Allergies  Codeine  Home Medications   Prior to Admission medications   Medication Sig Start Date End Date Taking? Authorizing Provider  colchicine 0.6 MG tablet Take two tabs by mouth now and then a third tablet in one hour 06/06/14   Ria ClockJennifer Lee H Presson, PA  indomethacin (INDOCIN) 25 MG capsule Take 50 mg by mouth 2 (two) times daily with a meal.     Historical Provider, MD  metoprolol (LOPRESSOR) 50 MG tablet Take 50 mg by mouth 2 (two) times daily.      Historical Provider, MD  oxyCODONE-acetaminophen (PERCOCET) 5-325 MG per tablet Take 1-2 tablets by mouth every 6 (six) hours as needed for pain. 01/07/13   Robyn M Hess, PA-C  predniSONE (DELTASONE) 10 MG tablet 5 po QD day 1, 4 po QD day 2, 3 PO QD day 3, 2 po QD day 4, 1 po QD day 5 that stop 06/07/14   Jess BartersJennifer Lee H Presson, PA   BP 166/115 mmHg  Pulse 66  Temp(Src) 97.9 F (36.6 C) (Oral)  Resp 18  SpO2 98%   Physical Exam  Constitutional: He is oriented to person, place, and time. He appears well-developed and well-nourished.  Non-toxic appearance. No distress.  Afebrile, non toxic, NAD, baseline HTN noted but otherwise VSS  HENT:  Head: Normocephalic and atraumatic.  Mouth/Throat: Mucous membranes are normal.  Eyes: Conjunctivae and EOM are normal. Pupils are equal, round, and reactive to light.  No conjunctival erythema/injection  Neck: Normal range of motion. Neck supple.  Cardiovascular: Normal rate and intact distal pulses.   Distal pulses intact  Pulmonary/Chest: Effort normal. No respiratory distress.  Abdominal: Normal appearance. He exhibits no distension.  Musculoskeletal:       Right knee: He exhibits decreased range of motion (due to pain), effusion and erythema (slight, mostly over lateral aspect of joint). He exhibits no laceration, normal alignment, no LCL laxity, normal patellar mobility, no bony tenderness and no MCL laxity. Tenderness found. Lateral joint line tenderness noted.       Legs: Right knee with ROM limited due to pain, mildly erythematous along the lateral aspect of joint, with warmth to the entire joint, small effusion noted. Mild lateral  joint line TTP, with superolateral jointline most focal area of tenderness. Gait antalgic but able to bear weight in bilateral lower extremities. Strength 5/5 in all extremities, sensation grossly intact, distal  pulses intact. No varus/valgus laxity, neg anterior drawer, passive flexion/extension nearly full without significant pain. No abnormal alignment or patellar mobility.   Neurological: He is alert and oriented to person, place, and time. He has normal strength. No sensory deficit. Gait (mildly antalgic) abnormal.  Skin: Skin is warm, dry and intact. There is erythema (right knee as above).  R knee erythema as noted above  Psychiatric: He has a normal mood and affect. His behavior is normal.  Nursing note and vitals reviewed.   ED Course  ARTHOCENTESIS Date/Time: 08/03/2014 4:12 PM Performed by: Allen DerryAMPRUBI-SOMS, Joanie Duprey STRUPP Authorized by: Ramond MarrowAMPRUBI-SOMS, Kris No STRUPP Consent: Verbal consent obtained. Risks and benefits: risks, benefits and alternatives were discussed Consent given by: patient Patient understanding: patient states understanding of the procedure being performed Patient consent: the patient's understanding of the procedure matches consent given Patient identity confirmed: verbally with patient Indications: diagnostic evaluation  Body area: knee Joint: right knee Local anesthesia used: yes Local anesthetic: topical anesthetic Patient sedated: no Preparation: Patient was prepped and draped in the usual sterile fashion. Needle gauge: 18 G  Ultrasound guidance: no Approach: medial Aspirate: yellow (straw colored with blood aspirated when needle retracted) Aspirate amount: 1 mL Patient tolerance: Patient tolerated the procedure well with no immediate complications Comments: 1cc straw colored synovial fluid aspirated, unable to milk out more fluid after several attempts. Used inferiormedial approach. Some blood aspirated when needle was retracted, no blood in synovium during aspiration.    (including critical care time)  DIAGNOSTIC STUDIES: Oxygen Saturation is 98% on RA, normal by my interpretation.    COORDINATION OF CARE: 3:02 PM-Discussed treatment plan which  includes knee arthrocentesis with pt at bedside and pt agreed to plan.   Labs Review Labs Reviewed  BODY FLUID CULTURE  GRAM STAIN  SYNOVIAL CELL COUNT + DIFF, W/ CRYSTALS    Imaging Review Dg Knee Complete 4 Views Right  08/03/2014   CLINICAL DATA:  Knee pain  EXAM: RIGHT KNEE - COMPLETE 4+ VIEW  COMPARISON:  None.  FINDINGS: There is no joint effusion. Sharpening the tibial spines and mild patellar spur formation is noted. No fracture or dislocation. No focal bone erosions identified. No soft tissue calcifications noted.  IMPRESSION: 1. No acute findings. 2. Mild osteoarthritis. No evidence to suggest crystalline deposition disease.   Electronically Signed   By: Signa Kell M.D.   On: 08/03/2014 16:04     EKG Interpretation None      MDM   Final diagnoses:  Pain  Knee joint pain, right  Knee swelling, right  Acute gout of right knee, unspecified cause  HTN (hypertension), benign    58 y.o. male with R knee pain, swelling, and erythema. Hx of gout but given location of erythema only being laterally, and not much jointline TTP. No systemic symptoms of infection, but still slight concern for possible infection therefore will obtain imaging and attempt knee aspiration for analysis. Pt is driving therefore declined meds here. Of note, pt didn't take BP meds, he ran out and needs a refill, but is asymptomatic. No further w/up needed for HTN. Will reassess shortly.  4:12 PM Xray showing mild OA without soft tissue calcifications, no effusion noted, no evidence of osteomyelitis. Proceeded with joint aspiration, which did not reveal much synovial fluid, only 1cc aspirated, straw colored without purulent appearance, mild blood seen when needle was retracted but did not appear to come from joint capsule. Sent for analysis, will hope to be able to obtain some analysis with minimal fluid. If no analysis can be performed, will empirically treat as gout and skin infection. Will reassess  shortly  5:13 PM Lab calling and stated that not enough synovial fluid to analyze. Will proceed with empiric infectious and gouty treatment. Pt stated that colchicine and indomethacin never help and prednisone is the only treatment that works. Discussed slight risk of prednisone decreasing immune system, but I believe infectious source is less likely therefore agreed to proceed with this treatment plan. Pt declined wanting knee sleeve or crutches. Discussed strict return precautions, and f/up with ortho in 1 week for reassessment. Pain meds given. Discussed RICE therapy. Pt requesting BP meds refilled, will refill now but discussed that pt needs to find PCP, will have him f/up with [facility]. I explained the diagnosis and have given explicit precautions to return to the ER including for any other new or worsening symptoms. The patient understands and accepts the medical plan as it's been dictated and I have answered their questions. Discharge instructions concerning home care and prescriptions have been given. The patient is STABLE  and is discharged to home in good condition.  BP 160/95 mmHg  Pulse 68  Temp(Src) 97.9 F (36.6 C) (Oral)  Resp 16  SpO2 100%  Meds ordered this encounter  Medications  . metoprolol (LOPRESSOR) 50 MG tablet    Sig: Take 1 tablet (50 mg total) by mouth 2 (two) times daily.    Dispense:  60 tablet    Refill:  0    Order Specific Question:  Supervising Provider    Answer:  Eber Hong D [[id number]]  . predniSONE (DELTASONE) 20 MG tablet    Sig: 3 tabs po daily x 4 days    Dispense:  12 tablet    Refill:  0    Order Specific Question:  Supervising Provider    Answer:  Eber Hong D [[id number]]  . naproxen (NAPROSYN) 500 MG tablet    Sig: Take 1 tablet (500 mg total) by mouth 2 (two) times daily as needed for mild pain, moderate pain or headache (TAKE WITH MEALS.).    Dispense:  20 tablet    Refill:  0    Order Specific Question:  Supervising Provider     Answer:  Eber Hong D [[id number]]  . oxyCODONE-acetaminophen (PERCOCET) 5-325 MG per tablet    Sig: Take 1 tablet by mouth every 6 (six) hours as needed for severe pain.    Dispense:  10 tablet    Refill:  0    Order Specific Question:  Supervising Provider    Answer:  Eber Hong D [[id number]]  . doxycycline (VIBRAMYCIN) 100 MG capsule    Sig: Take 1 capsule (100 mg total) by mouth 2 (two) times daily. One po bid x 7 days    Dispense:  14 capsule    Refill:  0    Order Specific Question:  Supervising Provider    Answer:  Eber Hong D [[id number]]     I personally performed the services described in this documentation, which was scribed in my presence. The recorded information has been reviewed and is accurate.  Donnita Falls [place], PA-C 08/03/14 1724  Richardean Canal, MD 08/04/14 [phone]

## 2014-09-15 ENCOUNTER — Emergency Department (HOSPITAL_COMMUNITY): Payer: Self-pay

## 2014-09-15 ENCOUNTER — Encounter (HOSPITAL_COMMUNITY): Payer: Self-pay | Admitting: Emergency Medicine

## 2014-09-15 ENCOUNTER — Emergency Department (INDEPENDENT_AMBULATORY_CARE_PROVIDER_SITE_OTHER)
Admission: EM | Admit: 2014-09-15 | Discharge: 2014-09-15 | Disposition: A | Discharge status: Nursing Home (Includes ICF) | Payer: Self-pay | Source: Home / Self Care | Attending: Family Medicine | Admitting: Family Medicine

## 2014-09-15 ENCOUNTER — Emergency Department (HOSPITAL_COMMUNITY)
Admission: EM | Admit: 2014-09-15 | Discharge: 2014-09-15 | Disposition: A | Discharge status: Home / Self Care | Payer: Self-pay | Attending: Emergency Medicine | Admitting: Emergency Medicine

## 2014-09-15 DIAGNOSIS — I471 Supraventricular tachycardia: Secondary | ICD-10-CM | POA: Insufficient documentation

## 2014-09-15 DIAGNOSIS — Z791 Long term (current) use of non-steroidal anti-inflammatories (NSAID): Secondary | ICD-10-CM | POA: Insufficient documentation

## 2014-09-15 DIAGNOSIS — Z7952 Long term (current) use of systemic steroids: Secondary | ICD-10-CM | POA: Insufficient documentation

## 2014-09-15 DIAGNOSIS — I10 Essential (primary) hypertension: Secondary | ICD-10-CM | POA: Insufficient documentation

## 2014-09-15 DIAGNOSIS — R000 Tachycardia, unspecified: Secondary | ICD-10-CM

## 2014-09-15 DIAGNOSIS — M109 Gout, unspecified: Secondary | ICD-10-CM | POA: Insufficient documentation

## 2014-09-15 DIAGNOSIS — Z79899 Other long term (current) drug therapy: Secondary | ICD-10-CM | POA: Insufficient documentation

## 2014-09-15 LAB — CBC WITH DIFFERENTIAL/PLATELET
BASOS ABS: 0 10*3/uL (ref 0.0–0.1)
Basophils Relative: 0 % (ref 0–1)
EOS PCT: 1 % (ref 0–5)
Eosinophils Absolute: 0.1 10*3/uL (ref 0.0–0.7)
HCT: 39.9 % (ref 39.0–52.0)
Hemoglobin: 14.1 g/dL (ref 13.0–17.0)
LYMPHS PCT: 45 % (ref 12–46)
Lymphs Abs: 4 10*3/uL (ref 0.7–4.0)
MCH: 33.8 pg (ref 26.0–34.0)
MCHC: 35.3 g/dL (ref 30.0–36.0)
MCV: 95.7 fL (ref 78.0–100.0)
Monocytes Absolute: 1 10*3/uL (ref 0.1–1.0)
Monocytes Relative: 11 % (ref 3–12)
NEUTROS ABS: 3.9 10*3/uL (ref 1.7–7.7)
Neutrophils Relative %: 43 % (ref 43–77)
PLATELETS: 144 10*3/uL — AB (ref 150–400)
RBC: 4.17 MIL/uL — AB (ref 4.22–5.81)
RDW: 14.1 % (ref 11.5–15.5)
WBC: 9 10*3/uL (ref 4.0–10.5)

## 2014-09-15 LAB — BASIC METABOLIC PANEL
Anion gap: 3 — ABNORMAL LOW (ref 5–15)
BUN: 9 mg/dL (ref 6–23)
CALCIUM: 9.1 mg/dL (ref 8.4–10.5)
CHLORIDE: 102 mmol/L (ref 96–112)
CO2: 35 mmol/L — ABNORMAL HIGH (ref 19–32)
CREATININE: 1.17 mg/dL (ref 0.50–1.35)
GFR calc non Af Amer: 67 mL/min — ABNORMAL LOW (ref >90)
GFR, EST AFRICAN AMERICAN: 77 mL/min — AB (ref >90)
GLUCOSE: 151 mg/dL — AB (ref 70–99)
POTASSIUM: 3.5 mmol/L (ref 3.5–5.1)
SODIUM: 140 mmol/L (ref 135–145)

## 2014-09-15 LAB — I-STAT TROPONIN, ED: TROPONIN I, POC: 0.06 ng/mL (ref 0.00–0.08)

## 2014-09-15 MED ORDER — ADENOSINE 6 MG/2ML IV SOLN
INTRAVENOUS | Status: AC
  Administered 2014-09-15: 6 mg
  Filled 2014-09-15: qty 8

## 2014-09-15 MED ORDER — SODIUM CHLORIDE 0.9 % IV SOLN
Freq: Once | INTRAVENOUS | Status: AC
  Administered 2014-09-15: 20:00:00 via INTRAVENOUS

## 2014-09-15 NOTE — ED Provider Notes (Signed)
Erik FickleFreddie York is a 59 y.o. male who presents to Urgent Care today for indigestion. For the past 3 days patient has had upset stomach and indigestion. He also notes subjective tachypalpitations. He has tried Tums and over-the-counter medications which have not helped. No chest pains weakness or numbness fevers or chills.  He notes that in the past he was admitted for tachycardia palpitations and they thought it was "A. Fib". He does not take medications daily to prevent atrial fibrillation.   Past Medical History  Diagnosis Date  . Hypertension   . Gout    History reviewed. No pertinent past surgical history. History  Substance Use Topics  . Smoking status: Never Smoker   . Smokeless tobacco: Not on file  . Alcohol Use: No   ROS as above Medications: Current Facility-Administered Medications  Medication Dose Route Frequency Provider Last Rate Last Dose  . 0.9 %  sodium chloride infusion   Intravenous Once Rodolph BongEvan S Anisten Tomassi, MD       Current Outpatient Prescriptions  Medication Sig Dispense Refill  . colchicine 0.6 MG tablet Take two tabs by mouth now and then a third tablet in one hour 3 tablet 0  . doxycycline (VIBRAMYCIN) 100 MG capsule Take 1 capsule (100 mg total) by mouth 2 (two) times daily. One po bid x 7 days 14 capsule 0  . indomethacin (INDOCIN) 25 MG capsule Take 50 mg by mouth 2 (two) times daily with a meal.    . metoprolol (LOPRESSOR) 50 MG tablet Take 50 mg by mouth 2 (two) times daily.      . metoprolol (LOPRESSOR) 50 MG tablet Take 1 tablet (50 mg total) by mouth 2 (two) times daily. 60 tablet 0  . naproxen (NAPROSYN) 500 MG tablet Take 1 tablet (500 mg total) by mouth 2 (two) times daily as needed for mild pain, moderate pain or headache (TAKE WITH MEALS.). 20 tablet 0  . oxyCODONE-acetaminophen (PERCOCET) 5-325 MG per tablet Take 1-2 tablets by mouth every 6 (six) hours as needed for pain. 10 tablet 0  . oxyCODONE-acetaminophen (PERCOCET) 5-325 MG per tablet Take 1  tablet by mouth every 6 (six) hours as needed for severe pain. 10 tablet 0  . predniSONE (DELTASONE) 10 MG tablet 5 po QD day 1, 4 po QD day 2, 3 PO QD day 3, 2 po QD day 4, 1 po QD day 5 that stop 15 tablet 0  . predniSONE (DELTASONE) 20 MG tablet 3 tabs po daily x 4 days 12 tablet 0   Allergies  Allergen Reactions  . Codeine Other (See Comments)    Upset stomache     Exam:  BP 148/100 mmHg  Pulse 205  Temp(Src) 98.8 F (37.1 C) (Oral)  Resp 16 Gen: Well NAD nontoxic appearing HEENT: EOMI,  MMM Lungs: Normal work of breathing. CTABL Heart: Tachycardia at a palpated rate around 200 bpm no MRG Abd: NABS, Soft. Nondistended, Nontender Exts: Brisk capillary refill, warm and well perfused.   Twelve-lead EKG shows SVT at a ventricular rate of 183 bpm. Narrow complex with a QRS duration of 108. No other significant abnormalities.  No results found for this or any previous visit (from the past 24 hour(s)). No results found.  Assessment and Plan: 59 y.o. male with narrow complex tachycardia with appears to be SVT based on EKG. Patient has likely had this for 3 days and it appears to be currently stable. IV started and we'll transfer patient to the ED via EMS for further  evaluation and management.   Discussed warning signs or symptoms. Please see discharge instructions. Patient expresses understanding.     Rodolph Bong, MD 09/15/14 [phone]

## 2014-09-15 NOTE — ED Notes (Signed)
Pt reports having indigestion sx on Sunday, took some medicine for indigestion with no relief. Pt states he also reports having palpitations, states they normally go away on their own but this time it did not. Pt denies any associated sx besides the indigestion feeling.

## 2014-09-15 NOTE — ED Provider Notes (Signed)
CSN: [id number]     Arrival date & time 09/15/14  2006 History   First MD Initiated Contact with Patient 09/15/14 2006     Chief Complaint  Patient presents with  . Chest Pain     (Consider location/radiation/quality/duration/timing/severity/associated sxs/prior Treatment) HPI 59 year old male with past medical history of hypertension, gout presents from urgent care for further management of indigestion which is been ongoing for 3 days. While at urgent care patient was found to be tachycardic into the 190s. Patient reports he has had a history of tachycardia and palpitations similar to this in the past for which she was admitted to the hospitalist follow-up with cardiology. Patient states the original thought was A. fib but states he was never that was never confirmed the patient was never started on any medications for treatment of that. Patient denies having any chest pain, shortness of breath, palpitations, nausea, vomiting at this time. Patient states he has been going about his normal daily activities over the past 3 days and he has had no issues. Patient denies drug or alcohol use. No other complaints at this time.     Past Medical History  Diagnosis Date  . Hypertension   . Gout    History reviewed. No pertinent past surgical history. No family history on file. History  Substance Use Topics  . Smoking status: Never Smoker   . Smokeless tobacco: Not on file  . Alcohol Use: No    Review of Systems  Constitutional: Negative for fever, activity change and appetite change.  HENT: Negative for congestion, rhinorrhea and sore throat.   Eyes: Negative for visual disturbance.  Respiratory: Negative for cough and shortness of breath.   Cardiovascular: Positive for palpitations. Negative for chest pain and leg swelling.  Gastrointestinal: Negative for nausea, vomiting, abdominal pain and diarrhea.  Genitourinary: Negative for dysuria, flank pain, decreased urine volume and difficulty  urinating.  Musculoskeletal: Negative for back pain and neck pain.  Skin: Negative for rash.  Neurological: Negative for dizziness, syncope, speech difficulty, weakness, light-headedness, numbness and headaches.  Psychiatric/Behavioral: Negative for confusion.      Allergies  Codeine  Home Medications   Prior to Admission medications   Medication Sig Start Date End Date Taking? Authorizing Provider  colchicine 0.6 MG tablet Take two tabs by mouth now and then a third tablet in one hour 06/06/14   Ria Clock, PA  doxycycline (VIBRAMYCIN) 100 MG capsule Take 1 capsule (100 mg total) by mouth 2 (two) times daily. One po bid x 7 days 08/03/14   Donnita Falls Camprubi-Soms, PA-C  indomethacin (INDOCIN) 25 MG capsule Take 50 mg by mouth 2 (two) times daily with a meal.    Historical Provider, MD  metoprolol (LOPRESSOR) 50 MG tablet Take 50 mg by mouth 2 (two) times daily.      Historical Provider, MD  metoprolol (LOPRESSOR) 50 MG tablet Take 1 tablet (50 mg total) by mouth 2 (two) times daily. 08/03/14   Mercedes Strupp Camprubi-Soms, PA-C  naproxen (NAPROSYN) 500 MG tablet Take 1 tablet (500 mg total) by mouth 2 (two) times daily as needed for mild pain, moderate pain or headache (TAKE WITH MEALS.). 08/03/14   Mercedes Strupp Camprubi-Soms, PA-C  oxyCODONE-acetaminophen (PERCOCET) 5-325 MG per tablet Take 1-2 tablets by mouth every 6 (six) hours as needed for pain. 01/07/13   Kathrynn Speed, PA-C  oxyCODONE-acetaminophen (PERCOCET) 5-325 MG per tablet Take 1 tablet by mouth every 6 (six) hours as needed for severe pain.  08/03/14   Mercedes Strupp Camprubi-Soms, PA-C  predniSONE (DELTASONE) 10 MG tablet 5 po QD day 1, 4 po QD day 2, 3 PO QD day 3, 2 po QD day 4, 1 po QD day 5 that stop 06/07/14   Ria Clock, PA  predniSONE (DELTASONE) 20 MG tablet 3 tabs po daily x 4 days 08/03/14   Donnita Falls Camprubi-Soms, PA-C   BP 159/93 mmHg  Pulse 101  Temp(Src) 98.3 F (36.8 C)  (Oral)  Resp 22  SpO2 100% Physical Exam  Constitutional: He is oriented to person, place, and time. He appears well-developed and well-nourished. No distress.  HENT:  Head: Normocephalic and atraumatic.  Nose: Nose normal.  Mouth/Throat: Oropharynx is clear and moist. No oropharyngeal exudate.  Eyes: Conjunctivae and EOM are normal.  Neck: Normal range of motion. Neck supple. No JVD present.  Cardiovascular: Regular rhythm, normal heart sounds and intact distal pulses.  Tachycardia present.   Pulses:      Radial pulses are 2+ on the right side, and 2+ on the left side.  Pulmonary/Chest: Effort normal and breath sounds normal. No respiratory distress.  Abdominal: Soft. He exhibits no distension. There is no tenderness. There is no rebound and no guarding.  Musculoskeletal: Normal range of motion.  Neurological: He is alert and oriented to person, place, and time. No cranial nerve deficit.  Skin: Skin is warm and dry. No rash noted.  Psychiatric: He has a normal mood and affect.  Nursing note and vitals reviewed.   ED Course  Procedures (including critical care time) Labs Review Labs Reviewed  CBC WITH DIFFERENTIAL/PLATELET - Abnormal; Notable for the following:    RBC 4.17 (*)    Platelets 144 (*)    All other components within normal limits  BASIC METABOLIC PANEL - Abnormal; Notable for the following:    CO2 35 (*)    Glucose, Bld 151 (*)    GFR calc non Af Amer 67 (*)    GFR calc Af Amer 77 (*)    Anion gap 3 (*)    All other components within normal limits  BLOOD GAS, VENOUS  I-STAT TROPOININ, ED    Imaging Review Dg Chest 2 View  09/15/2014   CLINICAL DATA:  Chest pain with palpitations since Monday.  EXAM: CHEST  2 VIEW  COMPARISON:  None.  FINDINGS: The heart size and mediastinal contours are within normal limits. Both lungs are clear. The visualized skeletal structures are unremarkable.  IMPRESSION: No active cardiopulmonary disease.   Electronically Signed   By:  Burman Nieves M.D.   On: 09/15/2014 21:00     EKG Interpretation   Date/Time:  Wednesday September 15 2014 20:20:48 EST Ventricular Rate:  110 PR Interval:  147 QRS Duration: 95 QT Interval:  336 QTC Calculation: 454 R Axis:   62 Text Interpretation:  Sinus tachycardia Multiple ventricular premature  complexes Probable left atrial enlargement Baseline wander in lead(s) II  III aVR aVL aVF V1 V2 V5 V6 svt resolved since last tracing Confirmed by  KNAPP  MD-J, JON ([id number]) on 09/15/2014 8:29:32 PM      MDM   Final diagnoses:  None   Erik York is a 59 y.o. male with H&P as above. EKG from UC reviewed and shows regular rhythm in 190s c/w SVT. On arrival, HR 180-190s and BP stable. Pt in NAD. Attempted leg raise / modified valsalva technique without success. Dr. Gala Romney present during our initial eval of pt and attempted carotid  massage which was also unsuccessful. 6 mg adenosine given and pt converted with complication. EKG shows NSR now. Will obtain CXR, screening labs and send troponin given 3 day h/o sxs.  W/u neg.   Clinical Impression: 1. SVT (supraventricular tachycardia)     Disposition: Discharge  Condition: Good  I have discussed the results, Dx and Tx plan with the pt(& family if present). He/she/they expressed understanding and agree(s) with the plan. Discharge instructions discussed at great length. Strict return precautions discussed and pt &/or family have verbalized understanding of the instructions. No further questions at time of discharge.    New Prescriptions   No medications on file    Follow Up: [facility] [address] [place] [phone] Schedule an appointment as soon as possible for a visit in 3 days   [facility] [address] [id number] [place] [id number] [phone]  If  symptoms worsen   Pt seen in conjunction with Dr. Linwood DibblesJon Knapp, MD  Ames DuraStephen Gerldine Suleiman, DO [facility] Emergency Medicine Resident - PGY-2    Ames DuraStephen Oluwaferanmi Wain, MD 09/15/14 2138

## 2014-09-15 NOTE — ED Provider Notes (Signed)
The patient presented to the emergency room with complaints of tachycardia. Patient had noticed some indigestion type symptoms the last few days. He went to an urgent care this evening and while there they found that his heart rate was 190. Patient did not realize that his heart had been racing. Denies any trouble with shortness of breath. He does report having a similar episode in the past that required a chemical cardioversion but this was many years ago. Physical Exam  BP 144/95 mmHg  Pulse 88  Temp(Src) 98.3 F (36.8 C) (Oral)  Resp 11  SpO2 99%  Physical Exam  Constitutional: He appears well-developed and well-nourished. No distress.  HENT:  Head: Normocephalic and atraumatic.  Right Ear: External ear normal.  Left Ear: External ear normal.  Eyes: Conjunctivae are normal. Right eye exhibits no discharge. Left eye exhibits no discharge. No scleral icterus.  Neck: Neck supple. No tracheal deviation present.  Cardiovascular: Tachycardia present.   Pulmonary/Chest: Effort normal. No stridor. No respiratory distress.  Musculoskeletal: He exhibits no edema.  Neurological: He is alert. Cranial nerve deficit: no gross deficits.  Skin: Skin is warm and dry. No rash noted.  Psychiatric: He has a normal mood and affect.  Nursing note and vitals reviewed.   ED Course  CARDIOVERSION Date/Time: 09/15/2014 9:43 PM Performed by: Linwood DibblesKNAPP, Lexis Potenza Authorized by: Linwood DibblesKNAPP, Tito Ausmus Consent: The procedure was performed in an emergent situation. Verbal consent obtained. Risks and benefits: risks, benefits and alternatives were discussed Consent given by: patient Patient sedated: no Cardioversion basis: emergent Pre-procedure rhythm: supraventricular tachycardia Number of attempts: 1 attempt with 6 mg adenosine. Post-procedure rhythm: normal sinus rhythm Complications: no complications Patient tolerance: Patient tolerated the procedure well with no immediate complications    MDM Dr Gala RomneyBensimhon happened to  be in the ED during the procedure.  He evaluated the patient as well. Will help arrange for outpatient followup.  Pt remained in sinus rhythm after the procedure.  Stable for discharge.      Linwood DibblesJon Margie Urbanowicz, MD 09/15/14 2145

## 2014-09-15 NOTE — Discharge Instructions (Signed)
Supraventricular Tachycardia °Supraventricular tachycardia (SVT) is when the heart beats very fast. SVT can last for a long time (sustained) or it can start and stop suddenly (nonsustained). °HOME CARE  °· Take your heart medicine as told by your doctor. Check with your doctor before taking cold, diet, or herbal medicine. °· Do not smoke. °· Do not drink large amounts of caffeine. Caffeine is found in coffee, tea, soda (pop, cola), and chocolate.  °· Keep all doctor visits as told. °GET HELP RIGHT AWAY IF:  °· You have chest pain or pressure. °· You cannot catch your breath. °· You are dizzy or lightheaded. °· You feel like you will pass out (faint). °· You are sweaty (diaphoretic) and feel sick to your stomach (nauseous) or throw up (vomit).   °If you have the above problems, call your local emergency services (911 in [place] right away. Do not drive yourself to the hospital. °MAKE SURE YOU:  °· Understand these instructions. °· Will watch your condition. °· Will get help right away if you are not doing well or get worse. °Document Released: 07/23/2005 Document Revised: 10/15/2011 Document Reviewed: 10/27/2008 °ExitCare® Patient Information ©2015 [employer]. This information is not intended to replace advice given to you by your health care provider. Make sure you discuss any questions you have with your health care provider. ° °

## 2014-09-15 NOTE — ED Notes (Signed)
Pt reports cp with palpitations since Monday. sts he thought he had indigestion. [facility] Pt has hx of being converted with medication before in past.

## 2014-09-15 NOTE — ED Notes (Signed)
[facility] called to transfer pt to the ED.  ED Charge RN, Minerva Areolaric was called and given report.  Pt is stable, resting comfortably on the examine table.

## 2014-09-15 NOTE — ED Notes (Signed)
Pt reports indigestion and rapid heart rate since Monday morning.  Pt denies chest pain, but does say he has the indigestion.

## 2014-09-29 ENCOUNTER — Telehealth: Payer: Self-pay | Admitting: Internal Medicine

## 2014-09-29 NOTE — Telephone Encounter (Signed)
New Message  Pt wanted to relay that he experienced another SVT episode on Monday and per pt- did whatever techniques he learned from Allred and it was relieved. Pt has [facility] sched for 3/9.

## 2014-09-29 NOTE — Telephone Encounter (Signed)
This is an Financial plannerYI for Dr Johney FrameAllred

## 2014-10-13 ENCOUNTER — Telehealth: Payer: Self-pay | Admitting: Internal Medicine

## 2014-10-13 ENCOUNTER — Ambulatory Visit (INDEPENDENT_AMBULATORY_CARE_PROVIDER_SITE_OTHER): Payer: Self-pay | Admitting: Internal Medicine

## 2014-10-13 ENCOUNTER — Encounter: Payer: Self-pay | Admitting: Internal Medicine

## 2014-10-13 VITALS — BP 180/112 | HR 96 | Ht 75.0 in | Wt 185.4 lb

## 2014-10-13 DIAGNOSIS — I10 Essential (primary) hypertension: Secondary | ICD-10-CM

## 2014-10-13 DIAGNOSIS — I471 Supraventricular tachycardia: Secondary | ICD-10-CM

## 2014-10-13 MED ORDER — AMLODIPINE BESYLATE 5 MG PO TABS: 5.0000 mg | ORAL_TABLET | Freq: Every day | ORAL | Status: AC

## 2014-10-13 NOTE — Progress Notes (Signed)
Primary Care Physician: Per Patient No Pcp Referring Physician:  Dr Gala Romney (from ED)   Erik York is a 59 y.o. male with a h/o HTN, SVT here for evaluation after presenting to [facility] with SVT, converted with adenosine. Pt states that he has had issues with fast heart rate for years, three times reporting to the ER  and receiving adenosine . Marland Kitchen Moved here from the [place] area 3 years ago with the majority of treatment for SVT in this area. On recent hospital visit, he was taught several valsalva maneuvers and has used them with success since discharge.  His BP is poorly controlled today, still seeing PCP in [place] with most recent visit one month ago and metoprolol was stopped and lisinopril 10 mg started . BP rechecked and still elevated at 180/112. He does not smoke, use alcohol/illicit drugs/caffeine.He does have a snoring history.  Today, he denies symptoms of palpitations, chest pain, shortness of breath, orthopnea, PND, lower extremity edema, dizziness, presyncope, syncope, or neurologic sequela. The patient is tolerating medications without difficulties and is otherwise without complaint today.   Past Medical History  Diagnosis Date  . Hypertension   . Gout    No past surgical history on file.  Current Outpatient Prescriptions  Medication Sig Dispense Refill  . aspirin EC 81 MG tablet Take 81 mg by mouth daily.    . colchicine 0.6 MG tablet Take 0.6 mg by mouth daily as needed (GOUT).    . indomethacin (INDOCIN) 25 MG capsule Take 50 mg by mouth 2 (two) times daily with a meal.    . lisinopril (PRINIVIL,ZESTRIL) 10 MG tablet Take 10 mg by mouth daily.    . naproxen (NAPROSYN) 500 MG tablet Take 1 tablet (500 mg total) by mouth 2 (two) times daily as needed for mild pain, moderate pain or headache (TAKE WITH MEALS.). 20 tablet 0  . amLODipine (NORVASC) 5 MG tablet Take 1 tablet (5 mg total) by mouth daily. 90 tablet 3   No current facility-administered medications for  this visit.    Allergies  Allergen Reactions  . Codeine Other (See Comments)    Upset stomach    History   Social History  . Marital Status: Married    Spouse Name: N/A  . Number of Children: N/A  . Years of Education: N/A   Occupational History  . Not on file.   Social History Main Topics  . Smoking status: Never Smoker   . Smokeless tobacco: Not on file  . Alcohol Use: No  . Drug Use: No  . Sexual Activity: Yes   Other Topics Concern  . Not on file   Social History Narrative    Family History  Problem Relation Age of Onset  . Diabetes Mother   . Cancer Maternal Aunt     Pt thinks it is stomach cancer  . Heart Problems Maternal Uncle     PACEMAKER  . Heart Problems Cousin     PACEMAKER    ROS- All systems are reviewed and negative except as per the HPI above  Physical Exam: Filed Vitals:   10/13/14 0827 10/13/14 1004  BP: 198/102 180/112  Pulse: 96   Height:  (1.905 m)   Weight: 185 lb 6.4 oz (84.097 kg)     GEN- The patient is well appearing, alert and oriented x 3 today.   Head- normocephalic, atraumatic Eyes-  Sclera clear, conjunctiva pink Ears- hearing intact Oropharynx- clear Neck- supple, no JVP Lymph- no cervical lymphadenopathy  Lungs- Clear to ausculation bilaterally, normal work of breathing Heart- Regular rate and rhythm, no murmurs, rubs or gallops, PMI not laterally displaced GI- soft, NT, ND, + BS Extremities- no clubbing, cyanosis, or edema MS- no significant deformity or atrophy Skin- no rash or lesion Psych- euthymic mood, full affect Neuro- strength and sensation are intact  EKG-EKG's reviewed in EPIC  EKG- today, NSR voltage criteria for LVH, Prololnged QT,   Assessment and Plan: 1. SVT  The patient has documented short RP adenosine sensitive SVT.  Therapeutic strategies for SVT ablation including medicine and ablation were discussed in detail with the patient today. Risk, benefits, and alternatives to EP  study and radiofrequency ablation for SVT were also discussed in detail today. These risks include but are not limited to stroke, bleeding, vascular damage, tamponade, perforation, damage to the esophagus, lungs, and other structures, pulmonary vein stenosis, worsening renal function, and death. The patient understands these risk and will notify office when financially he is able to proceed. In the interim he will continue to use valsalva maneuvers to break SVT episodes and prevent ER visits.  2. Poorly controlled HTN Add amlodipine 5 mg qd and continue lisinopril 10 mg daily. F/u with PCP for BP management.  Return in 6 months for management. Contact my office if he decides to proceed with ablation in the interim.

## 2014-10-13 NOTE — Patient Instructions (Addendum)
Your physician has recommended that you have an ablation. Catheter ablation is a medical procedure used to treat some cardiac arrhythmias (irregular heartbeats). During catheter ablation, a long, thin, flexible tube is put into a blood vessel in your groin (upper thigh), or neck. This tube is called an ablation catheter. It is then guided to your heart through the blood vessel. Radio frequency waves destroy small areas of heart tissue where abnormal heartbeats may cause an arrhythmia to start. Please see the instruction sheet given to you today.   Patient is going to orange card info faxed to office then will schedule ablation - [phone] attention [facility]  Your physician has requested that you have an echocardiogram. Echocardiography is a painless test that uses sound waves to create images of your heart. It provides your doctor with information about the size and shape of your heart and how well your heart's chambers and valves are working. This procedure takes approximately one hour. There are no restrictions for this procedure.  Your physician recommends that you schedule a follow-up appointment in: 1 week for bp check/nurse visit  Your physician has recommended you make the following change in your medication:  1)Norvasc 5mg  once daily

## 2014-10-13 NOTE — Telephone Encounter (Signed)
New Message      Pt calling stating that he found out that his Financial Assistance is still pending. Please call back.

## 2014-10-13 NOTE — Telephone Encounter (Signed)
Will call once his orange card is approved

## 2014-10-14 DIAGNOSIS — I471 Supraventricular tachycardia: Secondary | ICD-10-CM | POA: Insufficient documentation

## 2014-10-14 DIAGNOSIS — I10 Essential (primary) hypertension: Secondary | ICD-10-CM | POA: Insufficient documentation

## 2014-10-19 ENCOUNTER — Telehealth: Payer: Self-pay | Admitting: Internal Medicine

## 2014-10-19 NOTE — Telephone Encounter (Signed)
New message   Pt called reports that he was told by Nurse Tresa EndoKelly to call back when he received the orange card to schedule the ablation. Pt says the hospital doesn't issue orange cards anymore. He says he was advised to have the ablation scheduled and that they will issue the discount once it is scheduled. Please call back to discuss.

## 2014-10-19 NOTE — Telephone Encounter (Signed)
Patient is self pay.  He will be responsible for 45% of total bill.  Will set up a payment plan based on bill

## 2014-10-20 NOTE — Telephone Encounter (Signed)
Follow Up  Pt requesting to speak w/ Rn. Please call back and diiscuss.

## 2014-10-20 NOTE — Telephone Encounter (Signed)
Discussed with Dr Johney FrameAllred and called the patient and let him know Dr Johney FrameAllred does not suggest going forward until his card is in place  Call the hospital and speak with billing tell them I need to talk with someone in financial assistance to find out more of his responsibility

## 2014-10-21 NOTE — Telephone Encounter (Signed)
F/U       Pt calling states he is having a SVT attack but it is very slow and nothing major.    It has been happening since about 10 am.   Please return pt call.

## 2014-10-21 NOTE — Telephone Encounter (Signed)
Patient states that around 10 am today he had an episode of "slower than normal" SVT with associated dizziness. He states it lasted a couple of hours but has resolved after he repeated vagal maneuvers. He is currently lying down at home until his dizziness completely resolves. He states his rate is back to normal below 100. Denies SOB. States he had no other symptoms. Indicated that he is still awaiting his "card" for financial resources before he can schedule his ablation. Advised him that should he experience another episode to please call us back even if after 5pm, as we do have a MD on call at night. Also, encouraged him that if he has symptomatic fast heart rate (dizziness, sob, weakness, etc) that he should proceed to the ED but he should not drive. He could have someone else drive him or call [phone]. Patient verbalized understanding. Routing to Dr. Johney FrameAllred.

## 2014-10-22 NOTE — Telephone Encounter (Signed)
No other advice presently. Typically, urgent issues such as this should be discussed with in office MD.  I was off and did not check my inbox yesterday.

## 2014-10-25 ENCOUNTER — Telehealth: Payer: Self-pay | Admitting: Internal Medicine

## 2014-10-25 NOTE — Telephone Encounter (Signed)
Returned call to patient. Informed patient regarding Dr. Jenel LucksAllred's advisement that he can use "plain robitussin is recommended. Followup with primary care if cough does not improve." Patient verbalized understanding and appreciation for call back.

## 2014-10-25 NOTE — Telephone Encounter (Signed)
Patient states he had another episode yesterday that was just the same "slow irregular heart rate". He corrected this by doing vagal maneuvers. He is wanting to take cough syrup for a cough he got this weekend. Informed him I would need to check with Dr. Johney FrameAllred.

## 2014-10-25 NOTE — Telephone Encounter (Signed)
New message      Pt states he had a SVT attack yesterday.  It began at 10am-4pm.  Please call when you get a moment

## 2014-10-25 NOTE — Telephone Encounter (Signed)
Plain robitussin is recommended. Follow-up with primary care if cough does not improve.

## 2014-10-29 ENCOUNTER — Other Ambulatory Visit (HOSPITAL_COMMUNITY): Payer: Self-pay

## 2014-11-25 NOTE — Telephone Encounter (Signed)
Late entry from 10/25/14.  There were 2 phone entries for this day and this one got deleted.  Dr Johney FrameAllred does not feel strongly in regards to patient having ablation when he can break the SVT with vagal maneuvers at this point.  He will keep his follow up in 6 months

## 2015-02-09 ENCOUNTER — Ambulatory Visit (HOSPITAL_COMMUNITY): Payer: Medicaid Other | Attending: Internal Medicine

## 2015-02-09 ENCOUNTER — Telehealth: Payer: Self-pay

## 2015-02-09 ENCOUNTER — Other Ambulatory Visit: Payer: Self-pay | Admitting: Internal Medicine

## 2015-02-09 DIAGNOSIS — I471 Supraventricular tachycardia: Secondary | ICD-10-CM

## 2015-02-09 NOTE — Telephone Encounter (Signed)
Informed by registration that patient was not going to seen for blood pressure check. 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  Patient had told Elease Hashimotolisha that his blood pressure reading was 120/78.  Alisha agreed to tell the patient to call in his blood pressures so we can track them. My [facility] from [facility] in registration is that there are some type of financial issues as to the reason that he is not being seen today.

## 2016-06-18 IMAGING — DX DG CHEST 2V
2 series · 2 of 2 positions shown · non-contrast
Comparison: None.

CLINICAL DATA: Chest pain with palpitations since [REDACTED].

EXAM:
CHEST  2 VIEW

[chest pa]
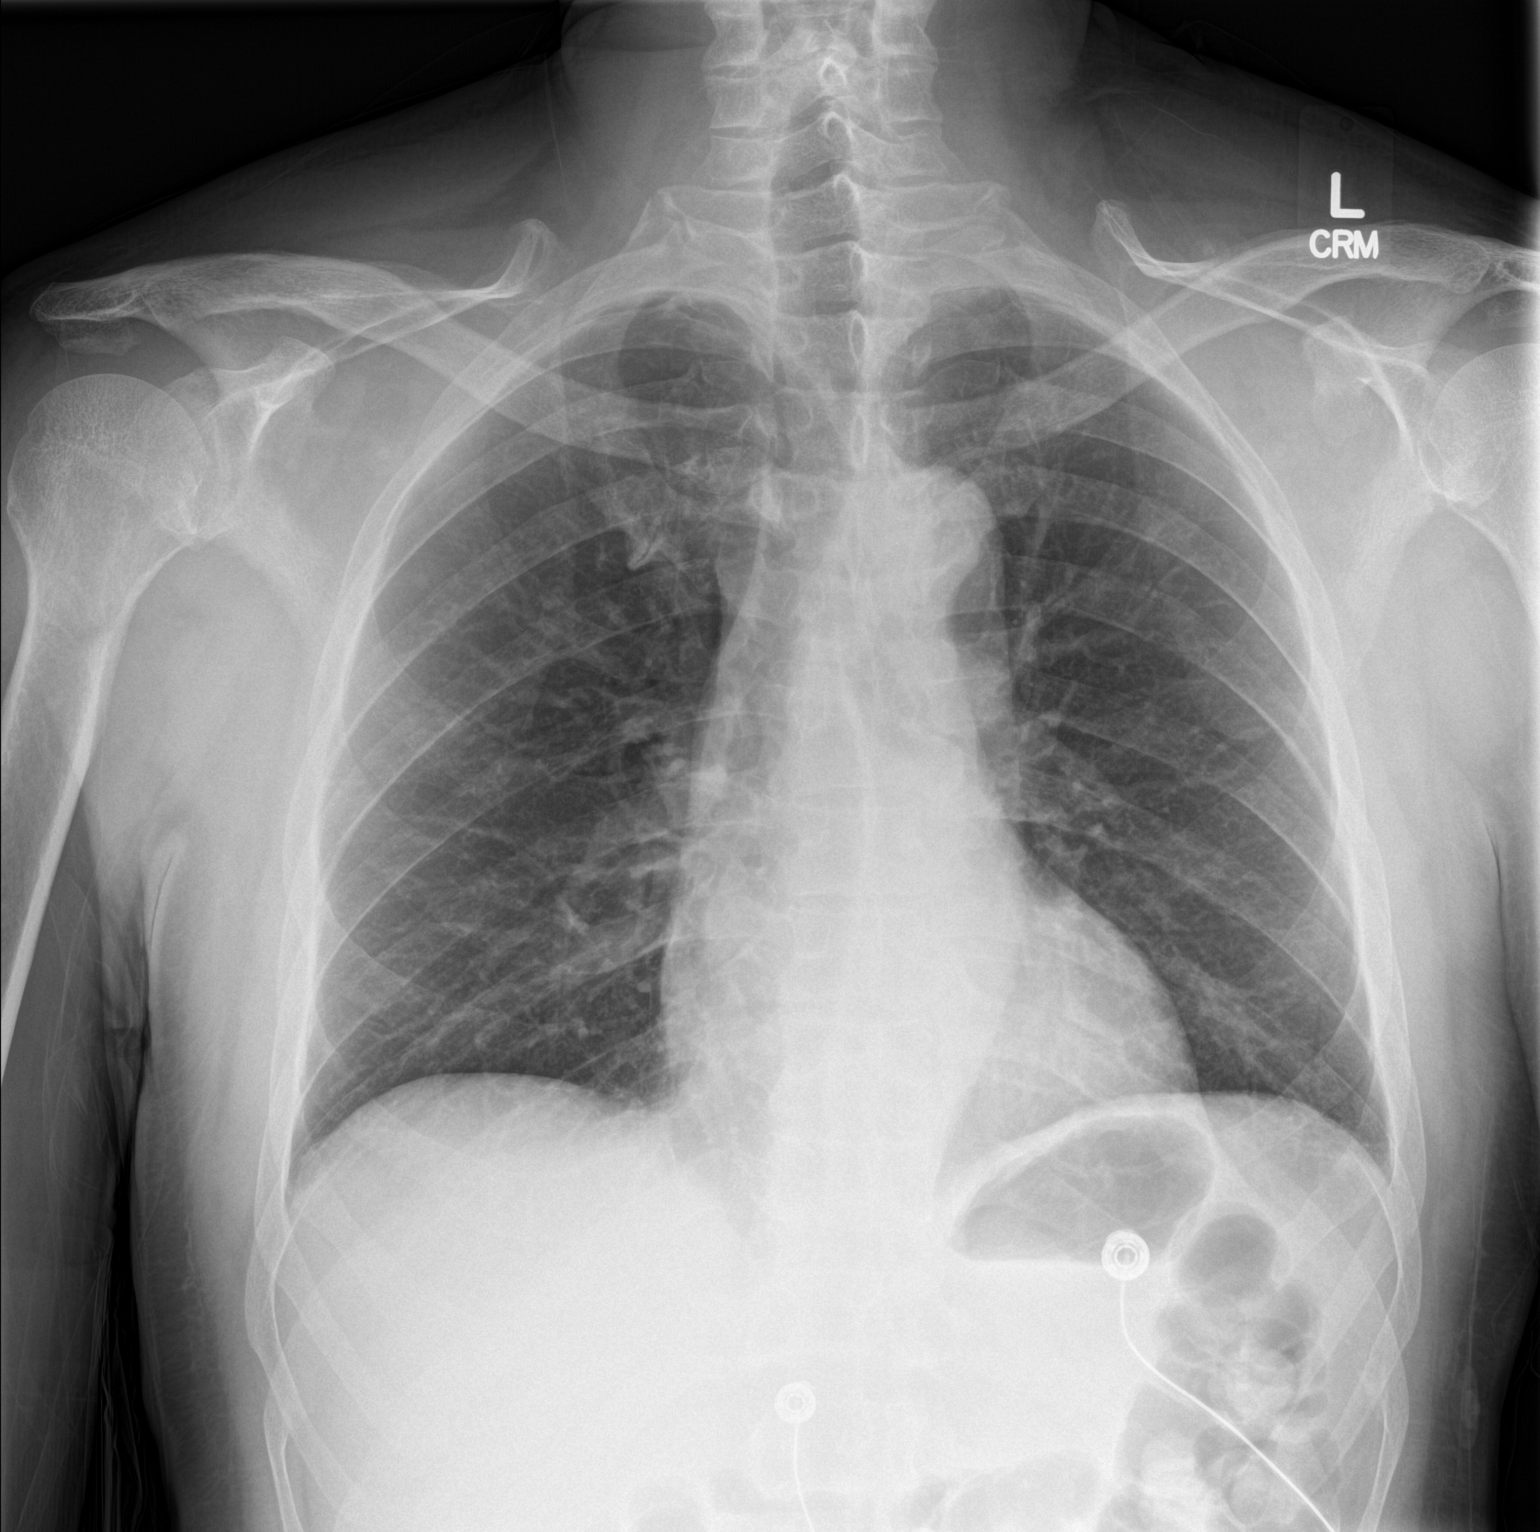

[chest lat]
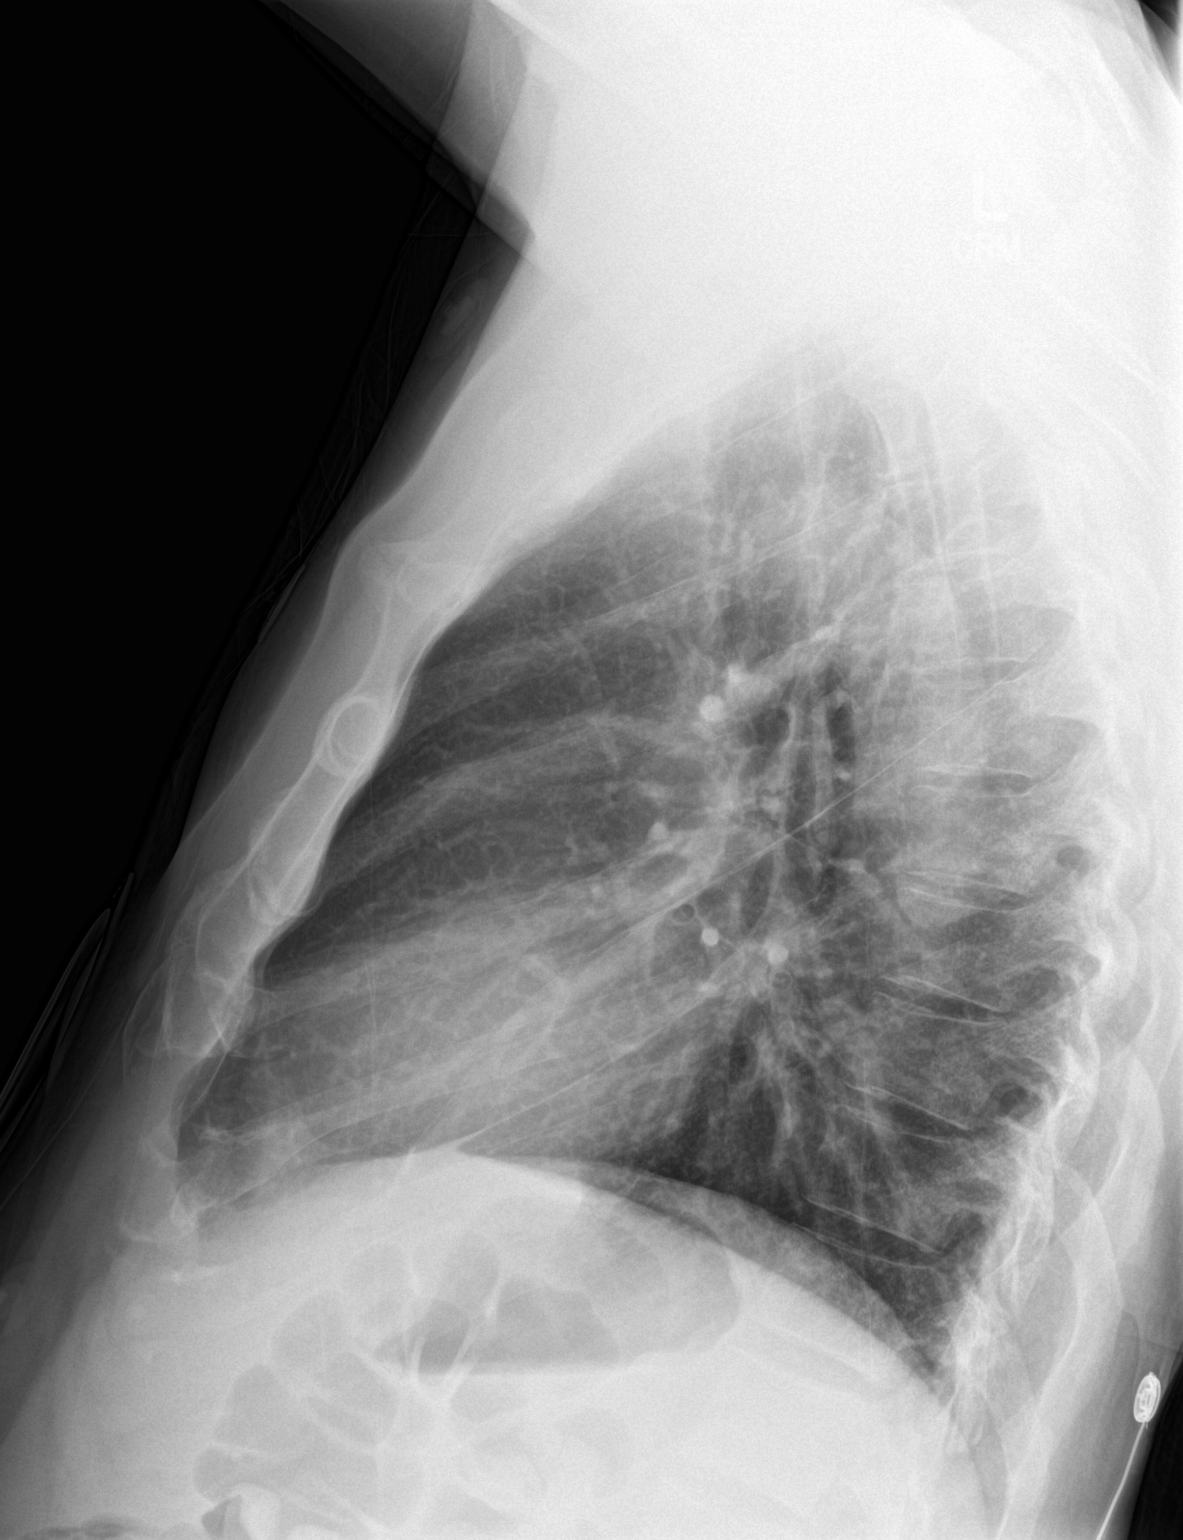

[2 of 2 positions shown; findings below may reference images not displayed]

FINDINGS: The heart size and mediastinal contours are within normal limits.
Both lungs are clear. The visualized skeletal structures are
unremarkable.
IMPRESSION: No active cardiopulmonary disease.
